# Patient Record
Sex: Female | Born: 1975 | Race: White | Hispanic: No | Marital: Single | Smoking: Current every day smoker
Health system: Southern US, Community
[De-identification: ages and names within clinical notes are randomized; demographics above are authoritative.]

## PROBLEM LIST (undated history)

## (undated) DIAGNOSIS — G459 Transient cerebral ischemic attack, unspecified: Secondary | ICD-10-CM

## (undated) DIAGNOSIS — J449 Chronic obstructive pulmonary disease, unspecified: Secondary | ICD-10-CM

## (undated) DIAGNOSIS — L0390 Cellulitis, unspecified: Secondary | ICD-10-CM

## (undated) DIAGNOSIS — D219 Benign neoplasm of connective and other soft tissue, unspecified: Secondary | ICD-10-CM

## (undated) DIAGNOSIS — D259 Leiomyoma of uterus, unspecified: Secondary | ICD-10-CM

## (undated) HISTORY — PX: TUBAL LIGATION: SHX77

## (undated) HISTORY — PX: CHOLECYSTECTOMY: SHX55

---

## 2012-11-28 DIAGNOSIS — I639 Cerebral infarction, unspecified: Secondary | ICD-10-CM

## 2012-11-28 HISTORY — DX: Cerebral infarction, unspecified: I63.9

## 2014-11-06 ENCOUNTER — Emergency Department: Payer: Self-pay | Admitting: Emergency Medicine

## 2015-02-07 ENCOUNTER — Emergency Department: Payer: Self-pay | Admitting: Emergency Medicine

## 2015-07-23 ENCOUNTER — Encounter: Payer: Self-pay | Admitting: Emergency Medicine

## 2015-07-23 ENCOUNTER — Emergency Department
Admission: EM | Admit: 2015-07-23 | Discharge: 2015-07-23 | Disposition: A | Discharge status: Home / Self Care | Payer: Self-pay | Attending: Emergency Medicine | Admitting: Emergency Medicine

## 2015-07-23 DIAGNOSIS — Z720 Tobacco use: Secondary | ICD-10-CM | POA: Insufficient documentation

## 2015-07-23 DIAGNOSIS — K2970 Gastritis, unspecified, without bleeding: Secondary | ICD-10-CM | POA: Insufficient documentation

## 2015-07-23 HISTORY — DX: Leiomyoma of uterus, unspecified: D25.9

## 2015-07-23 LAB — URINALYSIS COMPLETE WITH MICROSCOPIC (ARMC ONLY)
Bilirubin Urine: NEGATIVE
Glucose, UA: NEGATIVE mg/dL
HGB URINE DIPSTICK: NEGATIVE
Ketones, ur: NEGATIVE mg/dL
Nitrite: NEGATIVE
PH: 6 (ref 5.0–8.0)
PROTEIN: NEGATIVE mg/dL
SPECIFIC GRAVITY, URINE: 1.018 (ref 1.005–1.030)

## 2015-07-23 LAB — COMPREHENSIVE METABOLIC PANEL
ALBUMIN: 4.2 g/dL (ref 3.5–5.0)
ALT: 13 U/L — ABNORMAL LOW (ref 14–54)
AST: 23 U/L (ref 15–41)
Alkaline Phosphatase: 69 U/L (ref 38–126)
Anion gap: 6 (ref 5–15)
BUN: 8 mg/dL (ref 6–20)
CHLORIDE: 107 mmol/L (ref 101–111)
CO2: 23 mmol/L (ref 22–32)
Calcium: 9 mg/dL (ref 8.9–10.3)
Creatinine, Ser: 0.72 mg/dL (ref 0.44–1.00)
GFR calc Af Amer: >60 mL/min (ref >60)
Glucose, Bld: 127 mg/dL — ABNORMAL HIGH (ref 65–99)
POTASSIUM: 3.6 mmol/L (ref 3.5–5.1)
Sodium: 136 mmol/L (ref 135–145)
Total Bilirubin: 0.6 mg/dL (ref 0.3–1.2)
Total Protein: 7.2 g/dL (ref 6.5–8.1)

## 2015-07-23 LAB — CBC
HEMATOCRIT: 41.3 % (ref 35.0–47.0)
Hemoglobin: 13.9 g/dL (ref 12.0–16.0)
MCH: 31.5 pg (ref 26.0–34.0)
MCHC: 33.6 g/dL (ref 32.0–36.0)
MCV: 93.7 fL (ref 80.0–100.0)
Platelets: 289 10*3/uL (ref 150–440)
RBC: 4.41 MIL/uL (ref 3.80–5.20)
RDW: 12.8 % (ref 11.5–14.5)
WBC: 9.2 10*3/uL (ref 3.6–11.0)

## 2015-07-23 NOTE — Discharge Instructions (Signed)
Gastritis, Adult °Gastritis is soreness and puffiness (inflammation) of the lining of the stomach. If you do not get help, gastritis can cause bleeding and sores (ulcers) in the stomach. °HOME CARE  °· Only take medicine as told by your doctor. °· If you were given antibiotic medicines, take them as told. Finish the medicines even if you start to feel better. °· Drink enough fluids to keep your pee (urine) clear or pale yellow. °· Avoid foods and drinks that make your problems worse. Foods you may want to avoid include: °¨ Caffeine or alcohol. °¨ Chocolate. °¨ Mint. °¨ Garlic and onions. °¨ Spicy foods. °¨ Citrus fruits, including oranges, lemons, or limes. °¨ Food containing tomatoes, including sauce, chili, salsa, and pizza. °¨ Fried and fatty foods. °· Eat small meals throughout the day instead of large meals. °GET HELP RIGHT AWAY IF:  °· You have black or dark red poop (stools). °· You throw up (vomit) blood. It may look like coffee grounds. °· You cannot keep fluids down. °· Your belly (abdominal) pain gets worse. °· You have a fever. °· You do not feel better after 1 week. °· You have any other questions or concerns. °MAKE SURE YOU:  °· Understand these instructions. °· Will watch your condition. °· Will get help right away if you are not doing well or get worse. °Document Released: 05/02/2008 Document Revised: 02/06/2012 Document Reviewed: 12/28/2011 °ExitCare® Patient Information ©2015 [employer]. This information is not intended to replace advice given to you by your health care provider. Make sure you discuss any questions you have with your health care provider. ° °

## 2015-07-23 NOTE — ED Notes (Signed)
Pt ambulatory to triage with c/o headache and vomiting this AM. Pt reports right upper dental pain also from a bad tooth. Pt denies hx of migraines or headaches.

## 2015-07-23 NOTE — ED Provider Notes (Signed)
[facility] Emergency Department Provider Note  ____________________________________________  Time seen: On arrival  I have reviewed the triage vital signs and the nursing notes.   HISTORY  Chief Complaint Headache; Dental Pain; and Emesis    HPI Lindsey Acosta is a 39 y.o. female who presents with complaints of toothache and nausea that began this morning. 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 She is also concerned that her toothache which is been gone for a week may be related. She denies fevers chills. She denies diarrhea. No abdominal pain    Past Medical History  Diagnosis Date  . Fibroid uterus     There are no active problems to display for this patient.   Past Surgical History  Procedure Laterality Date  . Cholecystectomy    . Tubal ligation      No current outpatient prescriptions on file.  Allergies Review of patient's allergies indicates no known allergies.  No family history on file.  Social History Social History  Substance Use Topics  . Smoking status: Current Every Day Smoker    Types: Cigarettes  . Smokeless tobacco: None  . Alcohol Use: No    Review of Systems  Constitutional: Negative for fever. Eyes: Negative for visual changes. ENT: Negative for sore throat. Positive for dental pain   Genitourinary: Negative for dysuria. Musculoskeletal: Negative for back pain. Skin: Negative for rash. Neurological: Positive for headaches  ____________________________________________   PHYSICAL EXAM:  VITAL SIGNS: ED Triage Vitals  Enc Vitals Group     BP 07/23/15 1133 131/72 mmHg     Pulse Rate 07/23/15 1133 94     Resp 07/23/15 1133 20     Temp 07/23/15 1133 97.9 F (36.6 C)     Temp Source 07/23/15 1133 Oral     SpO2 07/23/15 1133 98 %     Weight 07/23/15 1133 160 lb (72.576 kg)     Height  07/23/15 1133 5' (1.524 m)     Head Cir --      Peak Flow --      Pain Score 07/23/15 1134 7     Pain Loc --      Pain Edu? --      Excl. in [place]? --      Constitutional: Alert and oriented. Well appearing and in no distress. Eyes: Conjunctivae are normal.  ENT   Head: Normocephalic and atraumatic.   Mouth/Throat: Mucous membranes are moist. No dental abscess noted. Positive gingivitis Cardiovascular: Normal rate, regular rhythm.  Respiratory: Normal respiratory effort without tachypnea nor retractions.  Gastrointestinal: Soft and non-tender in all quadrants. No distention. There is no CVA tenderness. Musculoskeletal: Nontender with normal range of motion in all extremities. Neurologic:  Normal speech and language. No gross focal neurologic deficits are appreciated. Skin:  Skin is warm, dry and intact. No rash noted. Psychiatric: Mood and affect are normal. Patient exhibits appropriate insight and judgment.  ____________________________________________    LABS (pertinent positives/negatives)  Labs Reviewed  COMPREHENSIVE METABOLIC PANEL - Abnormal; Notable for the following:    Glucose, Bld 127 (*)    ALT 13 (*)    All other components within normal limits  URINALYSIS COMPLETEWITH MICROSCOPIC ([facility] ONLY) - Abnormal; Notable for the following:    Color, Urine YELLOW (*)    APPearance CLOUDY (*)    Leukocytes, UA 3+ (*)  Bacteria, UA RARE (*)    Squamous Epithelial / LPF TOO NUMEROUS TO COUNT (*)    All other components within normal limits  CBC    ____________________________________________     ____________________________________________    RADIOLOGY I have personally reviewed any xrays that were ordered on this patient: None  ____________________________________________   PROCEDURES  Procedure(s) performed: none   ____________________________________________   INITIAL IMPRESSION / ASSESSMENT AND PLAN / ED COURSE  Pertinent labs & imaging  results that were available during my care of the patient were reviewed by me and considered in my medical decision making (see chart for details).  Patient overall well-appearing, no abdominal tenderness to palpation. Vitals are unremarkable. We'll discharge her home she has Zofran at home.  ____________________________________________   FINAL CLINICAL IMPRESSION(S) / ED DIAGNOSES  Final diagnoses:  Gastritis     Lavonia Drafts, MD 07/23/15 [phone]

## 2015-08-13 ENCOUNTER — Emergency Department: Payer: Self-pay

## 2015-08-13 ENCOUNTER — Encounter: Payer: Self-pay | Admitting: Emergency Medicine

## 2015-08-13 ENCOUNTER — Emergency Department
Admission: EM | Admit: 2015-08-13 | Discharge: 2015-08-13 | Disposition: A | Discharge status: Home / Self Care | Payer: Self-pay | Attending: Student | Admitting: Student

## 2015-08-13 DIAGNOSIS — Y998 Other external cause status: Secondary | ICD-10-CM | POA: Insufficient documentation

## 2015-08-13 DIAGNOSIS — Z720 Tobacco use: Secondary | ICD-10-CM | POA: Insufficient documentation

## 2015-08-13 DIAGNOSIS — W108XXA Fall (on) (from) other stairs and steps, initial encounter: Secondary | ICD-10-CM | POA: Insufficient documentation

## 2015-08-13 DIAGNOSIS — S5001XA Contusion of right elbow, initial encounter: Secondary | ICD-10-CM | POA: Insufficient documentation

## 2015-08-13 DIAGNOSIS — Y9389 Activity, other specified: Secondary | ICD-10-CM | POA: Insufficient documentation

## 2015-08-13 DIAGNOSIS — Y9289 Other specified places as the place of occurrence of the external cause: Secondary | ICD-10-CM | POA: Insufficient documentation

## 2015-08-13 MED ORDER — HYDROCODONE-ACETAMINOPHEN 5-325 MG PO TABS: 1.0000 | ORAL_TABLET | ORAL | Status: DC | PRN

## 2015-08-13 NOTE — Discharge Instructions (Signed)
Contusion °A contusion is a deep bruise. Contusions happen when an injury causes bleeding under the skin. Signs of bruising include pain, puffiness (swelling), and discolored skin. The contusion may turn blue, purple, or yellow. °HOME CARE  °· Put ice on the injured area. °· Put ice in a plastic bag. °· Place a towel between your skin and the bag. °· Leave the ice on for 15-20 minutes, 03-04 times a day. °· Only take medicine as told by your doctor. °· Rest the injured area. °· If possible, raise (elevate) the injured area to lessen puffiness. °GET HELP RIGHT AWAY IF:  °· You have more bruising or puffiness. °· You have pain that is getting worse. °· Your puffiness or pain is not helped by medicine. °MAKE SURE YOU:  °· Understand these instructions. °· Will watch your condition. °· Will get help right away if you are not doing well or get worse. °Document Released: 05/02/2008 Document Revised: 02/06/2012 Document Reviewed: 09/19/2011 °ExitCare® Patient Information ©2015 [employer]. This information is not intended to replace advice given to you by your health care provider. Make sure you discuss any questions you have with your health care provider. ° °Cryotherapy °Cryotherapy is when you put ice on your injury. Ice helps lessen pain and puffiness (swelling) after an injury. Ice works the best when you start using it in the first 24 to 48 hours after an injury. °HOME CARE °· Put a dry or damp towel between the ice pack and your skin. °· You may press gently on the ice pack. °· Leave the ice on for no more than 10 to 20 minutes at a time. °· Check your skin after 5 minutes to make sure your skin is okay. °· Rest at least 20 minutes between ice pack uses. °· Stop using ice when your skin loses feeling (numbness). °· Do not use ice on someone who cannot tell you when it hurts. This includes small children and people with memory problems (dementia). °GET HELP RIGHT AWAY IF: °· You have white spots on your  skin. °· Your skin turns blue or pale. °· Your skin feels waxy or hard. °· Your puffiness gets worse. °MAKE SURE YOU:  °· Understand these instructions. °· Will watch your condition. °· Will get help right away if you are not doing well or get worse. °Document Released: 05/02/2008 Document Revised: 02/06/2012 Document Reviewed: 07/07/2011 °ExitCare® Patient Information ©2015 [employer]. This information is not intended to replace advice given to you by your health care provider. Make sure you discuss any questions you have with your health care provider. ° °

## 2015-08-13 NOTE — ED Provider Notes (Signed)
[facility] Emergency Department Donye Dauenhauer Note  ____________________________________________  Time seen: Approximately 7:47 PM  I have reviewed the triage vital signs and the nursing notes.   HISTORY  Chief Complaint Arm Injury   HPI Lindsey Acosta is a 39 y.o. female is here with complaint of right elbow pain. Patient states that she hit her elbow here on concrete or railing and now is having severe pain. She states there is very painful to move. She denies any previous fracture to her elbow. She is not taking any over-the-counter medication prior to arrival. Currently she rates her pain 10 out of 10.   Past Medical History  Diagnosis Date  . Fibroid uterus     There are no active problems to display for this patient.   Past Surgical History  Procedure Laterality Date  . Cholecystectomy    . Tubal ligation      Current Outpatient Rx  Name  Route  Sig  Dispense  Refill  . HYDROcodone-acetaminophen (NORCO/VICODIN) 5-325 MG per tablet   Oral   Take 1 tablet by mouth every 4 (four) hours as needed for moderate pain.   20 tablet   0     Allergies Review of patient's allergies indicates no known allergies.  No family history on file.  Social History Social History  Substance Use Topics  . Smoking status: Current Every Day Smoker    Types: Cigarettes  . Smokeless tobacco: None  . Alcohol Use: No    Review of Systems Constitutional: No fever/chills .Cardiovascular: Denies chest pain. Respiratory: Denies shortness of breath. Gastrointestinal:  No nausea, no vomiting.   Genitourinary: Negative for dysuria. Musculoskeletal: Negative for back pain. Skin: Negative for rash. Neurological: Negative for headaches, focal weakness or numbness.  10-point ROS otherwise negative.  ____________________________________________   PHYSICAL EXAM:  VITAL SIGNS: ED Triage Vitals  Enc Vitals Group     BP 08/13/15 1811 142/90 mmHg     Pulse Rate  08/13/15 1811 84     Resp --      Temp 08/13/15 1811 98.2 F (36.8 C)     Temp Source 08/13/15 1811 Oral     SpO2 08/13/15 1811 99 %     Weight 08/13/15 1811 162 lb (73.483 kg)     Height 08/13/15 1811 5' (1.524 m)     Head Cir --      Peak Flow --      Pain Score 08/13/15 1811 10     Pain Loc --      Pain Edu? --      Excl. in [place]? --     Constitutional: Alert and oriented. Well appearing and in no acute distress. Eyes: Conjunctivae are normal. PERRL. EOMI. Head: Atraumatic. Nose: No congestion/rhinnorhea. Neck: No stridor.   Cardiovascular: Normal rate, regular rhythm. Grossly normal heart sounds.  Good peripheral circulation. Respiratory: Normal respiratory effort.  No retractions. Lungs CTAB. Gastrointestinal: Soft and nontender. No distention.  Musculoskeletal: Right posterior elbow no gross deformity was noted. There is no effusion or soft tissue edema noted. Range of motion is restricted secondary to patient's pain. No lower extremity tenderness nor edema.  No joint effusions. Neurologic:  Normal speech and language. No gross focal neurologic deficits are appreciated. No gait instability. Skin:  Skin is warm, dry and intact. No rash noted. No abrasion or ecchymosis was noted. Psychiatric: Mood and affect are normal. Speech and behavior are normal.  ____________________________________________   LABS (all labs ordered are listed, but only  abnormal results are displayed)  Labs Reviewed - No data to display  RADIOLOGY  Right elbow x-ray per radiologist was negative I, Johnn Hai, personally viewed and evaluated these images (plain radiographs) as part of my medical decision making.  ____________________________________________   PROCEDURES  Procedure(s) performed: None  Critical Care performed: No  ____________________________________________   INITIAL IMPRESSION / ASSESSMENT AND PLAN / ED COURSE  Pertinent labs & imaging results that were available  during my care of the patient were reviewed by me and considered in my medical decision making (see chart for details).  Patient had been wearing a sling prior to exam. She was told that she could use ice and elevate with a sling. She is follow-up with her doctor if any continued problems or urgent concerns. ____________________________________________   FINAL CLINICAL IMPRESSION(S) / ED DIAGNOSES  Final diagnoses:  Contusion of right elbow, initial encounter      Johnn Hai, PA-C 08/13/15 2300  Joanne Gavel, MD 08/13/15 [phone]

## 2015-08-13 NOTE — ED Notes (Signed)
Pt with right elbow pain after hitting it on a metal stair rail prior to arrival.

## 2015-08-13 NOTE — ED Notes (Signed)
Pt states she was running up the stairs and fell hit her elbow on either the concrete wall or railing she isn't sure states its very painful when she moves it.

## 2016-05-26 ENCOUNTER — Emergency Department
Admission: EM | Admit: 2016-05-26 | Discharge: 2016-05-26 | Disposition: A | Discharge status: Home / Self Care | Payer: Self-pay | Attending: Emergency Medicine | Admitting: Emergency Medicine

## 2016-05-26 ENCOUNTER — Encounter: Payer: Self-pay | Admitting: Emergency Medicine

## 2016-05-26 DIAGNOSIS — Z79899 Other long term (current) drug therapy: Secondary | ICD-10-CM | POA: Insufficient documentation

## 2016-05-26 DIAGNOSIS — J40 Bronchitis, not specified as acute or chronic: Secondary | ICD-10-CM

## 2016-05-26 DIAGNOSIS — Z8742 Personal history of other diseases of the female genital tract: Secondary | ICD-10-CM | POA: Insufficient documentation

## 2016-05-26 DIAGNOSIS — J209 Acute bronchitis, unspecified: Secondary | ICD-10-CM | POA: Insufficient documentation

## 2016-05-26 DIAGNOSIS — F17210 Nicotine dependence, cigarettes, uncomplicated: Secondary | ICD-10-CM | POA: Insufficient documentation

## 2016-05-26 DIAGNOSIS — F1290 Cannabis use, unspecified, uncomplicated: Secondary | ICD-10-CM | POA: Insufficient documentation

## 2016-05-26 MED ORDER — ALBUTEROL SULFATE HFA 108 (90 BASE) MCG/ACT IN AERS: 2.0000 | INHALATION_SPRAY | Freq: Four times a day (QID) | RESPIRATORY_TRACT | Status: AC | PRN

## 2016-05-26 MED ORDER — AMOXICILLIN 500 MG PO CAPS
500.0000 mg | ORAL_CAPSULE | Freq: Once | ORAL | Status: AC
  Administered 2016-05-26: 500 mg via ORAL
  Filled 2016-05-26: qty 1

## 2016-05-26 MED ORDER — AMOXICILLIN 500 MG PO TABS: 500.0000 mg | ORAL_TABLET | Freq: Three times a day (TID) | ORAL | Status: DC

## 2016-05-26 MED ORDER — HYDROCODONE-HOMATROPINE 5-1.5 MG/5ML PO SYRP: 5.0000 mL | ORAL_SOLUTION | Freq: Four times a day (QID) | ORAL | Status: DC | PRN

## 2016-05-26 NOTE — ED Notes (Signed)
Pt discharged to home.  Family member driving.  Discharge instructions reviewed.  Verbalized understanding.  No questions or concerns at this time.  Teach back verified.  Pt in NAD.  No items left in ED.   

## 2016-05-26 NOTE — ED Notes (Signed)
Pt. State cold/flu like symptoms for the past week.  Pt. States body aches, cough, congestion and fever.  Pt. States taking some OTC medications with little relief.

## 2016-05-26 NOTE — ED Notes (Signed)
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

## 2016-05-26 NOTE — Discharge Instructions (Signed)
Upper Respiratory Infection, Adult Most upper respiratory infections (URIs) are a viral infection of the air passages leading to the lungs. A URI affects the nose, throat, and upper air passages. The most common type of URI is nasopharyngitis and is typically referred to as "the common cold." URIs run their course and usually go away on their own. Most of the time, a URI does not require medical attention, but sometimes a bacterial infection in the upper airways can follow a viral infection. This is called a secondary infection. Sinus and middle ear infections are common types of secondary upper respiratory infections. Bacterial pneumonia can also complicate a URI. A URI can worsen asthma and chronic obstructive pulmonary disease (COPD). Sometimes, these complications can require emergency medical care and may be life threatening.  CAUSES Almost all URIs are caused by viruses. A virus is a type of germ and can spread from one person to another.  RISKS FACTORS You may be at risk for a URI if:   You smoke.   You have chronic heart or lung disease.  You have a weakened defense (immune) system.   You are very young or very old.   You have nasal allergies or asthma.  You work in crowded or poorly ventilated areas.  You work in health care facilities or schools. SIGNS AND SYMPTOMS  Symptoms typically develop 2-3 days after you come in contact with a cold virus. Most viral URIs last 7-10 days. However, viral URIs from the influenza virus (flu virus) can last 14-18 days and are typically more severe. Symptoms may include:   Runny or stuffy (congested) nose.   Sneezing.   Cough.   Sore throat.   Headache.   Fatigue.   Fever.   Loss of appetite.   Pain in your forehead, behind your eyes, and over your cheekbones (sinus pain).  Muscle aches.  DIAGNOSIS  Your health care provider may diagnose a URI by:  Physical exam.  Tests to check that your symptoms are not due to  another condition such as:  Strep throat.  Sinusitis.  Pneumonia.  Asthma. TREATMENT  A URI goes away on its own with time. It cannot be cured with medicines, but medicines may be prescribed or recommended to relieve symptoms. Medicines may help:  Reduce your fever.  Reduce your cough.  Relieve nasal congestion. HOME CARE INSTRUCTIONS   Take medicines only as directed by your health care provider.   Gargle warm saltwater or take cough drops to comfort your throat as directed by your health care provider.  Use a warm mist humidifier or inhale steam from a shower to increase air moisture. This may make it easier to breathe.  Drink enough fluid to keep your urine clear or pale yellow.   Eat soups and other clear broths and maintain good nutrition.   Rest as needed.   Return to work when your temperature has returned to normal or as your health care provider advises. You may need to stay home longer to avoid infecting others. You can also use a face mask and careful hand washing to prevent spread of the virus.  Increase the usage of your inhaler if you have asthma.   Do not use any tobacco products, including cigarettes, chewing tobacco, or electronic cigarettes. If you need help quitting, ask your health care provider. PREVENTION  The best way to protect yourself from getting a cold is to practice good hygiene.   Avoid oral or hand contact with people with cold  symptoms.   Wash your hands often if contact occurs.  There is no clear evidence that vitamin C, vitamin E, echinacea, or exercise reduces the chance of developing a cold. However, it is always recommended to get plenty of rest, exercise, and practice good nutrition.  SEEK MEDICAL CARE IF:   You are getting worse rather than better.   Your symptoms are not controlled by medicine.   You have chills.  You have worsening shortness of breath.  You have brown or red mucus.  You have yellow or brown nasal  discharge.  You have pain in your face, especially when you bend forward.  You have a fever.  You have swollen neck glands.  You have pain while swallowing.  You have white areas in the back of your throat. SEEK IMMEDIATE MEDICAL CARE IF:   You have severe or persistent:  Headache.  Ear pain.  Sinus pain.  Chest pain.  You have chronic lung disease and any of the following:  Wheezing.  Prolonged cough.  Coughing up blood.  A change in your usual mucus.  You have a stiff neck.  You have changes in your:  Vision.  Hearing.  Thinking.  Mood. MAKE SURE YOU:   Understand these instructions.  Will watch your condition.  Will get help right away if you are not doing well or get worse.   This information is not intended to replace advice given to you by your health care provider. Make sure you discuss any questions you have with your health care provider.   Document Released: 05/10/2001 Document Revised: 03/31/2015 Document Reviewed: 02/19/2014 Elsevier Interactive Patient Education 2016 Elsevier Inc.   Take antibiotic and cough syrup as directed. Return to emergency room for any worsening symptoms.

## 2016-05-26 NOTE — ED Provider Notes (Signed)
[facility] Emergency Department Provider Note  ____________________________________________  Time seen: Approximately 10:21 PM  I have reviewed the triage vital signs and the nursing notes.   HISTORY  Chief Complaint Nasal Congestion and Otalgia    HPI Lindsey Acosta is a 40 y.o. female with one-week history of cough, congestion, sore throat, ear pain. Some chest tightness and wheezing. She smokes one half pack per day. Works as a [employer]arry manager.   Past Medical History  Diagnosis Date  . Fibroid uterus     There are no active problems to display for this patient.   Past Surgical History  Procedure Laterality Date  . Cholecystectomy    . Tubal ligation      Current Outpatient Rx  Name  Route  Sig  Dispense  Refill  . albuterol (PROVENTIL HFA;VENTOLIN HFA) 108 (90 Base) MCG/ACT inhaler   Inhalation   Inhale 2 puffs into the lungs every 6 (six) hours as needed for wheezing or shortness of breath.   1 Inhaler   2   . amoxicillin (AMOXIL) 500 MG tablet   Oral   Take 1 tablet (500 mg total) by mouth 3 (three) times daily.   30 tablet   0   . HYDROcodone-acetaminophen (NORCO/VICODIN) 5-325 MG per tablet   Oral   Take 1 tablet by mouth every 4 (four) hours as needed for moderate pain.   20 tablet   0   . HYDROcodone-homatropine (HYCODAN) 5-1.5 MG/5ML syrup   Oral   Take 5 mLs by mouth every 6 (six) hours as needed for cough.   120 mL   0     Allergies Review of patient's allergies indicates no known allergies.  History reviewed. No pertinent family history.  Social History Social History  Substance Use Topics  . Smoking status: Current Every Day Smoker -- 0.50 packs/day    Types: Cigarettes  . Smokeless tobacco: None  . Alcohol Use: No    Review of Systems Constitutional:  fever/chills have resolved. Eyes: No visual changes. ENT: No sore throat. Cardiovascular: Denies chest pain. Respiratory: Per history of present  illness Gastrointestinal: No abdominal pain.   Nauseated last week. With  diarrhea.  No constipation. Musculoskeletal: Negative for back pain. Skin: Negative for rash. Neurological: Negative for focal weakness or numbness. 10-point ROS otherwise negative.  ____________________________________________   PHYSICAL EXAM:  VITAL SIGNS: ED Triage Vitals  Enc Vitals Group     BP 05/26/16 2150 125/76 mmHg     Pulse Rate 05/26/16 2150 94     Resp 05/26/16 2150 18     Temp 05/26/16 2150 98.7 F (37.1 C)     Temp Source 05/26/16 2150 Oral     SpO2 05/26/16 2150 97 %     Weight 05/26/16 2150 150 lb (68.04 kg)     Height 05/26/16 2150 5' (1.524 m)     Head Cir --      Peak Flow --      Pain Score 05/26/16 2150 8     Pain Loc --      Pain Edu? --      Excl. in [place]? --     Constitutional: Alert and oriented. Well appearing and in no acute distress. Eyes: Conjunctivae are normal. PERRL. EOMI. Ears:  Clear with normal landmarks. No erythema. Head: Atraumatic. Nose: No congestion/rhinnorhea. Mouth/Throat: Mucous membranes are moist.  Oropharynx non-erythematous. No lesions. Neck:  Supple.  No adenopathy.   Cardiovascular: Normal rate, regular rhythm. Grossly normal heart sounds.  Good peripheral circulation. Respiratory: Normal respiratory effort. Few rhonchi on left Musculoskeletal: Nml ROM of upper and lower extremity joints. Neurologic:  Normal speech and language. No gross focal neurologic deficits are appreciated. No gait instability. Skin:  Skin is warm, dry and intact. No rash noted. Psychiatric: Mood and affect are normal. Speech and behavior are normal.  ____________________________________________   LABS (all labs ordered are listed, but only abnormal results are displayed)  Labs Reviewed - No data to  display ____________________________________________  EKG   ____________________________________________  RADIOLOGY   ____________________________________________   PROCEDURES  Procedure(s) performed: None  Critical Care performed: No  ____________________________________________   INITIAL IMPRESSION / ASSESSMENT AND PLAN / ED COURSE  Pertinent labs & imaging results that were available during my care of the patient were reviewed by me and considered in my medical decision making (see chart for details).  40 year old with one-week history of cough, congestion, wheezing. Not improving. Concern for bronchitis. She is a smoker. Started on amoxicillin. Given albuterol and Hycodan. She reports having a few prednisone at home that she can take for a few days. Recommended 30 mg, 21 g then 10 mg. She can follow-up with her primary physician or return to the emergency room for any worsening symptoms. ____________________________________________   FINAL CLINICAL IMPRESSION(S) / ED DIAGNOSES  Final diagnoses:  Bronchitis      Mortimer Fries, PA-C 05/26/16 2224  Mortimer Fries, PA-C 05/26/16 [place], MD 05/26/16 [phone]

## 2016-06-04 ENCOUNTER — Emergency Department
Admission: EM | Admit: 2016-06-04 | Discharge: 2016-06-04 | Disposition: A | Discharge status: Home / Self Care | Payer: Self-pay | Attending: Emergency Medicine | Admitting: Emergency Medicine

## 2016-06-04 ENCOUNTER — Emergency Department: Payer: Self-pay

## 2016-06-04 ENCOUNTER — Encounter: Payer: Self-pay | Admitting: Emergency Medicine

## 2016-06-04 DIAGNOSIS — Z79899 Other long term (current) drug therapy: Secondary | ICD-10-CM | POA: Insufficient documentation

## 2016-06-04 DIAGNOSIS — L03116 Cellulitis of left lower limb: Secondary | ICD-10-CM | POA: Insufficient documentation

## 2016-06-04 DIAGNOSIS — F17210 Nicotine dependence, cigarettes, uncomplicated: Secondary | ICD-10-CM | POA: Insufficient documentation

## 2016-06-04 LAB — CBC WITH DIFFERENTIAL/PLATELET
BASOS ABS: 0.1 10*3/uL (ref 0–0.1)
Basophils Relative: 1 %
Eosinophils Absolute: 0.2 10*3/uL (ref 0–0.7)
Eosinophils Relative: 2 %
HEMATOCRIT: 41.7 % (ref 35.0–47.0)
Hemoglobin: 14 g/dL (ref 12.0–16.0)
LYMPHS PCT: 30 %
Lymphs Abs: 3.4 10*3/uL (ref 1.0–3.6)
MCH: 31.6 pg (ref 26.0–34.0)
MCHC: 33.5 g/dL (ref 32.0–36.0)
MCV: 94.4 fL (ref 80.0–100.0)
MONO ABS: 0.7 10*3/uL (ref 0.2–0.9)
Monocytes Relative: 7 %
NEUTROS ABS: 7 10*3/uL — AB (ref 1.4–6.5)
Neutrophils Relative %: 60 %
Platelets: 307 10*3/uL (ref 150–440)
RBC: 4.42 MIL/uL (ref 3.80–5.20)
RDW: 12.8 % (ref 11.5–14.5)
WBC: 11.5 10*3/uL — ABNORMAL HIGH (ref 3.6–11.0)

## 2016-06-04 LAB — BASIC METABOLIC PANEL
ANION GAP: 7 (ref 5–15)
BUN: 8 mg/dL (ref 6–20)
CHLORIDE: 105 mmol/L (ref 101–111)
CO2: 24 mmol/L (ref 22–32)
Calcium: 8.9 mg/dL (ref 8.9–10.3)
Creatinine, Ser: 0.73 mg/dL (ref 0.44–1.00)
Glucose, Bld: 89 mg/dL (ref 65–99)
POTASSIUM: 3.7 mmol/L (ref 3.5–5.1)
SODIUM: 136 mmol/L (ref 135–145)

## 2016-06-04 MED ORDER — NAPROXEN 500 MG PO TABS: 500.0000 mg | ORAL_TABLET | Freq: Two times a day (BID) | ORAL | Status: DC

## 2016-06-04 MED ORDER — SULFAMETHOXAZOLE-TRIMETHOPRIM 800-160 MG PO TABS: 1.0000 | ORAL_TABLET | Freq: Two times a day (BID) | ORAL | Status: DC

## 2016-06-04 MED ORDER — TRAMADOL HCL 50 MG PO TABS: 50.0000 mg | ORAL_TABLET | Freq: Four times a day (QID) | ORAL | Status: DC | PRN

## 2016-06-04 NOTE — Discharge Instructions (Signed)

## 2016-06-04 NOTE — ED Provider Notes (Signed)
[facility] Emergency Department Provider Note  ____________________________________________  Time seen: Approximately 6:39 PM  I have reviewed the triage vital signs and the nursing notes.   HISTORY  Chief Complaint Leg Swelling and Leg Pain    HPI Lindsey Acosta is a 40 y.o. female , NAD, presents to the emergency department with 2 day history of left lower leg pain and redness. States she has an area of skin that is warm to touch with increased pain over the last couple of days. Pain worsens with ambulation and patient notes that she is unable to bear full weight on the left lower extremity. Notes that "cellulitis runs in her family".  Denies any injury or trauma to the leg.  Denies chest pain, shortness of breath, wheezing, visual changes, numbness, weakness, tingling. Has had no fevers, chills, body aches.   Past Medical History  Diagnosis Date  . Fibroid uterus     There are no active problems to display for this patient.   Past Surgical History  Procedure Laterality Date  . Cholecystectomy    . Tubal ligation      Current Outpatient Rx  Name  Route  Sig  Dispense  Refill  . albuterol (PROVENTIL HFA;VENTOLIN HFA) 108 (90 Base) MCG/ACT inhaler   Inhalation   Inhale 2 puffs into the lungs every 6 (six) hours as needed for wheezing or shortness of breath.   1 Inhaler   2   . amoxicillin (AMOXIL) 500 MG tablet   Oral   Take 1 tablet (500 mg total) by mouth 3 (three) times daily.   30 tablet   0   . HYDROcodone-acetaminophen (NORCO/VICODIN) 5-325 MG per tablet   Oral   Take 1 tablet by mouth every 4 (four) hours as needed for moderate pain.   20 tablet   0   . HYDROcodone-homatropine (HYCODAN) 5-1.5 MG/5ML syrup   Oral   Take 5 mLs by mouth every 6 (six) hours as needed for cough.   120 mL   0   . naproxen (NAPROSYN) 500 MG tablet   Oral   Take 1 tablet (500 mg total) by mouth 2 (two) times daily with a meal.   14 tablet   0   .  sulfamethoxazole-trimethoprim (BACTRIM DS,SEPTRA DS) 800-160 MG tablet   Oral   Take 1 tablet by mouth 2 (two) times daily.   14 tablet   0   . traMADol (ULTRAM) 50 MG tablet   Oral   Take 1 tablet (50 mg total) by mouth every 6 (six) hours as needed.   10 tablet   0     Allergies Review of patient's allergies indicates no known allergies.  History reviewed. No pertinent family history.  Social History Social History  Substance Use Topics  . Smoking status: Current Every Day Smoker -- 0.50 packs/day    Types: Cigarettes  . Smokeless tobacco: None  . Alcohol Use: No     Review of Systems  Constitutional: No fever/chills, fatigue Eyes: No visual changes.  Cardiovascular: No chest pain. Respiratory: No shortness of breath. No wheezing.  Gastrointestinal: No abdominal pain.  No nausea, vomiting.   Musculoskeletal: Positive left lower extremity pain. Negative for back pain.  Skin: Positive redness and swelling left lower extremity. Negative for rash, skin sores, open wounds, oozing, weeping. Neurological: Negative for headaches, focal weakness or numbness. No tingling 10-point ROS otherwise negative.  ____________________________________________   PHYSICAL EXAM:  VITAL SIGNS: ED Triage Vitals  Enc Vitals  Group     BP 06/04/16 1622 117/81 mmHg     Pulse Rate 06/04/16 1622 88     Resp 06/04/16 1622 18     Temp 06/04/16 1622 98.2 F (36.8 C)     Temp Source 06/04/16 1622 Oral     SpO2 06/04/16 1622 98 %     Weight 06/04/16 1622 155 lb (70.308 kg)     Height 06/04/16 1622 5' (1.524 m)     Head Cir --      Peak Flow --      Pain Score 06/04/16 1623 9     Pain Loc --      Pain Edu? --      Excl. in [place]? --      Constitutional: Alert and oriented. Well appearing and in no acute distress. Eyes: Conjunctivae are normal.  Head: Atraumatic. Neck: Supple with full range of motion. Hematological/Lymphatic/Immunilogical: No cervical lymphadenopathy. Cardiovascular:  Normal rate, regular rhythm. Normal S1 and S2.  Good peripheral circulation with 2+ pulses noted in the left lower extremity. Capillary refill is brisk in all digits of the left foot. Respiratory: Normal respiratory effort without tachypnea or retractions. Lungs CTAB with breath sounds noted in all lung fields. Musculoskeletal: Full range of motion of the left ankle but pain with flexion at the area of cellulitis. No pain with extension of the left ankle. Full range of motion of toes without pain. No lower extremity edema.  No joint effusions. Neurologic:  Normal speech and language. No gross focal neurologic deficits are appreciated. Sensation to light touch grossly intact about the left lower extremity. Skin:  3 cm x 2 cm oblong area of erythematous, warm skin noted about the anterior portion of the distal left lower extremity. Patient notes that is exquisitely tender to touch. Has pain with palpation distally into the foot but no other areas of cellulitis. No open wounds or lesions. No oozing or weeping. There is a tattoo proximal to the area of erythema but the patient states the tattoo is over 15 years old. Skin is warm, dry and intact.  Psychiatric: Mood and affect are normal. Speech and behavior are normal. Patient exhibits appropriate insight and judgement.   ____________________________________________   LABS (all labs ordered are listed, but only abnormal results are displayed)  Labs Reviewed  CBC WITH DIFFERENTIAL/PLATELET - Abnormal; Notable for the following:    WBC 11.5 (*)    Neutro Abs 7.0 (*)    All other components within normal limits  BASIC METABOLIC PANEL   ____________________________________________  EKG  None ____________________________________________  RADIOLOGY I have personally viewed and evaluated these images (plain radiographs) as part of my medical decision making, as well as reviewing the written report by the radiologist.  US Venous Img Lower Unilateral  Left  06/04/2016  CLINICAL DATA:  Left lower extremity pain, erythema and swelling for 1 day. EXAM: LEFT LOWER EXTREMITY VENOUS DOPPLER ULTRASOUND TECHNIQUE: Gray-scale sonography with graded compression, as well as color Doppler and duplex ultrasound were performed to evaluate the lower extremity deep venous systems from the level of the common femoral vein and including the common femoral, femoral, profunda femoral, popliteal and calf veins including the posterior tibial, peroneal and gastrocnemius veins when visible. The superficial great saphenous vein was also interrogated. Spectral Doppler was utilized to evaluate flow at rest and with distal augmentation maneuvers in the common femoral, femoral and popliteal veins. COMPARISON:  None. FINDINGS: Contralateral Common Femoral Vein: Respiratory phasicity is normal and symmetric  with the symptomatic side. No evidence of thrombus. Normal compressibility. Common Femoral Vein: No evidence of thrombus. Normal compressibility, respiratory phasicity and response to augmentation. Saphenofemoral Junction: No evidence of thrombus. Normal compressibility and flow on color Doppler imaging. Profunda Femoral Vein: No evidence of thrombus. Normal compressibility and flow on color Doppler imaging. Femoral Vein: No evidence of thrombus. Normal compressibility, respiratory phasicity and response to augmentation. Popliteal Vein: No evidence of thrombus. Normal compressibility, respiratory phasicity and response to augmentation. Calf Veins: No evidence of thrombus. Normal compressibility and flow on color Doppler imaging. Superficial Great Saphenous Vein: No evidence of thrombus. Normal compressibility and flow on color Doppler imaging. Venous Reflux:  None. Other Findings:  None. IMPRESSION: No evidence of deep venous thrombosis in the left lower extremity. Electronically Signed   By: Ilona Sorrel M.D.   On: 06/04/2016 18:18     ____________________________________________    PROCEDURES  Procedure(s) performed: None    Medications - No data to display   ____________________________________________   INITIAL IMPRESSION / ASSESSMENT AND PLAN / ED COURSE  Pertinent labs & imaging results that were available during my care of the patient were reviewed by me and considered in my medical decision making (see chart for details).  Patient's diagnosis is consistent with cellulitis of left leg without foot. Patient will be discharged home with prescriptions for naproxen, Bactrim DS, Ultram to take as directed. Patient is advised to keep left lower extremity elevated when not ambulating and keep ambulation to limited to necessary walking over the next couple of days. Patient does have a set of crutches at home that she may use to assist with ambulation. Patient was given a work note to excuse from work over the next 48 hours to allow rest and healing. Patient is to follow up with her primary care provider at [facility] in 48 hours if symptoms persist past this treatment course. Patient is given ED precautions to return to the ED for any worsening or new symptoms.    ____________________________________________  FINAL CLINICAL IMPRESSION(S) / ED DIAGNOSES  Final diagnoses:  Cellulitis of left leg without foot      NEW MEDICATIONS STARTED DURING THIS VISIT:  New Prescriptions   NAPROXEN (NAPROSYN) 500 MG TABLET    Take 1 tablet (500 mg total) by mouth 2 (two) times daily with a meal.   SULFAMETHOXAZOLE-TRIMETHOPRIM (BACTRIM DS,SEPTRA DS) 800-160 MG TABLET    Take 1 tablet by mouth 2 (two) times daily.   TRAMADOL (ULTRAM) 50 MG TABLET    Take 1 tablet (50 mg total) by mouth every 6 (six) hours as needed.         Braxton Feathers, PA-C 06/04/16 2000  Orbie Pyo, MD 06/04/16 2322

## 2016-06-04 NOTE — ED Notes (Addendum)
Pt presents to ED with c/o left lower leg pain and redness since yesterday. Warm to the touch and increased pain with ambulation. No known injury. Pt states "cellulitis runs in her family" and she thinks she may need an antibiotic. Denies fever at home. Light redness and warmth noted just above ankle.

## 2016-06-04 NOTE — ED Notes (Signed)
Pt presents to ed with left lower leg redness and swelling. Extremity warm to the touch. Pt sitting comfortably in bed. [place] at bedside to take her for testing.

## 2016-09-04 ENCOUNTER — Emergency Department
Admission: EM | Admit: 2016-09-04 | Discharge: 2016-09-04 | Disposition: A | Discharge status: Home / Self Care | Payer: Self-pay | Attending: Emergency Medicine | Admitting: Emergency Medicine

## 2016-09-04 ENCOUNTER — Encounter: Payer: Self-pay | Admitting: Emergency Medicine

## 2016-09-04 DIAGNOSIS — F17210 Nicotine dependence, cigarettes, uncomplicated: Secondary | ICD-10-CM | POA: Insufficient documentation

## 2016-09-04 DIAGNOSIS — Z791 Long term (current) use of non-steroidal anti-inflammatories (NSAID): Secondary | ICD-10-CM | POA: Insufficient documentation

## 2016-09-04 DIAGNOSIS — L03116 Cellulitis of left lower limb: Secondary | ICD-10-CM | POA: Insufficient documentation

## 2016-09-04 DIAGNOSIS — Z79899 Other long term (current) drug therapy: Secondary | ICD-10-CM | POA: Insufficient documentation

## 2016-09-04 HISTORY — DX: Benign neoplasm of connective and other soft tissue, unspecified: D21.9

## 2016-09-04 HISTORY — DX: Cellulitis, unspecified: L03.90

## 2016-09-04 MED ORDER — SULFAMETHOXAZOLE-TRIMETHOPRIM 800-160 MG PO TABS: 1.0000 | ORAL_TABLET | Freq: Two times a day (BID) | ORAL | 0 refills | Status: DC

## 2016-09-04 MED ORDER — SULFAMETHOXAZOLE-TRIMETHOPRIM 800-160 MG PO TABS
1.0000 | ORAL_TABLET | Freq: Once | ORAL | Status: AC
  Administered 2016-09-04: 1 via ORAL
  Filled 2016-09-04: qty 1

## 2016-09-04 NOTE — ED Triage Notes (Signed)
Patient with redness and swelling to left lower leg that started yesterday. Patient with history of cellulitis to the same leg in June.

## 2016-09-04 NOTE — ED Provider Notes (Signed)
[facility] Emergency Department Provider Note   ____________________________________________   First MD Initiated Contact with Patient 09/04/16 [phone]     (approximate)  I have reviewed the triage vital signs and the nursing notes.   HISTORY  Chief Complaint Cellulitis   HPI Lindsey Acosta is a 40 y.o. female with a history of cellulitis this past July who is presenting to the emergency department today with redness to the left lower extremity. She says that she bumped her left lower extremity on a patient lift at her job and started having redness ever since this happened. She said that she had this "bump" 5 days ago. Denies any fever but does express pain to the left lower 70. Says that she had a similar case after also bumping herself on the lift in July. Had Bactrim at that time which she tolerated well. It resolved the infection at that time as well.    Past Medical History:  Diagnosis Date  . Cellulitis   . Fibroid uterus   . Fibroids     There are no active problems to display for this patient.   Past Surgical History:  Procedure Laterality Date  . CHOLECYSTECTOMY    . TUBAL LIGATION      Prior to Admission medications   Medication Sig Start Date End Date Taking? Authorizing Provider  albuterol (PROVENTIL HFA;VENTOLIN HFA) 108 (90 Base) MCG/ACT inhaler Inhale 2 puffs into the lungs every 6 (six) hours as needed for wheezing or shortness of breath. 05/26/16   Mortimer Fries, PA-C  amoxicillin (AMOXIL) 500 MG tablet Take 1 tablet (500 mg total) by mouth 3 (three) times daily. 05/26/16   Mortimer Fries, PA-C  HYDROcodone-acetaminophen (NORCO/VICODIN) 5-325 MG per tablet Take 1 tablet by mouth every 4 (four) hours as needed for moderate pain. 08/13/15   Johnn Hai, PA-C  HYDROcodone-homatropine [facility]) 5-1.5 MG/5ML syrup Take 5 mLs by mouth every 6 (six) hours as needed for cough. 05/26/16   Mortimer Fries, PA-C  naproxen (NAPROSYN) 500 MG tablet  Take 1 tablet (500 mg total) by mouth 2 (two) times daily with a meal. 06/04/16   Jami L Hagler, PA-C  sulfamethoxazole-trimethoprim (BACTRIM DS,SEPTRA DS) 800-160 MG tablet Take 1 tablet by mouth 2 (two) times daily. 09/04/16   Orbie Pyo, MD  traMADol (ULTRAM) 50 MG tablet Take 1 tablet (50 mg total) by mouth every 6 (six) hours as needed. 06/04/16   Jami L Hagler, PA-C    Allergies Eggs or egg-derived products; Lemon oil; Lime oil; and Sulfa antibiotics  No family history on file.  Social History Social History  Substance Use Topics  . Smoking status: Current Every Day Smoker    Packs/day: 0.50    Types: Cigarettes  . Smokeless tobacco: Never Used  . Alcohol use No    Review of Systems Constitutional: No fever/chills Eyes: No visual changes. ENT: No sore throat. Cardiovascular: Denies chest pain. Respiratory: Denies shortness of breath. Gastrointestinal: No abdominal pain.  No nausea, no vomiting.  No diarrhea.  No constipation. Genitourinary: Negative for dysuria. Musculoskeletal: Negative for back pain. Skin: As above Neurological: Negative for headaches, focal weakness or numbness.  10-point ROS otherwise negative.  ____________________________________________   PHYSICAL EXAM:  VITAL SIGNS: ED Triage Vitals [09/04/16 0600]  Enc Vitals Group     BP 113/72     Pulse Rate 88     Resp 18     Temp 98.3 F (36.8 C)     Temp Source  Oral     SpO2 99 %     Weight 145 lb (65.8 kg)     Height 5' (1.524 m)     Head Circumference      Peak Flow      Pain Score 8     Pain Loc      Pain Edu?      Excl. in [place]?     Constitutional: Alert and oriented. Well appearing and in no acute distress. Eyes: Conjunctivae are normal. PERRL. EOMI. Head: Atraumatic. Nose: No congestion/rhinnorhea. Mouth/Throat: Mucous membranes are moist.  Neck: No stridor.   Cardiovascular: Normal rate, regular rhythm. Grossly normal heart sounds.  Respiratory: Normal respiratory  effort.  No retractions. Lungs CTAB. Gastrointestinal: Soft and nontender. No distention.  Musculoskeletal: No lower extremity tenderness nor edema.  No joint effusions. Neurologic:  Normal speech and language. No gross focal neurologic deficits are appreciated.  Skin:  Skin is warm, dry and intact. Left lower extremity with erythema extending from the lateral malleolus proximally about 6 cm. It is also in a medial collateral dimension about 7-8 centimeters. Tender to palpation. No fluctuance. No induration. Patient is very tender but she said that this tenderness was similar to the last instance of cellulitis. Psychiatric: Mood and affect are normal. Speech and behavior are normal.  ____________________________________________   LABS (all labs ordered are listed, but only abnormal results are displayed)  Labs Reviewed - No data to display ____________________________________________  EKG   ____________________________________________  RADIOLOGY   ____________________________________________   PROCEDURES  Procedure(s) performed:   Procedures  Critical Care performed:   ____________________________________________   INITIAL IMPRESSION / ASSESSMENT AND PLAN / ED COURSE  Pertinent labs & imaging results that were available during my care of the patient were reviewed by me and considered in my medical decision making (see chart for details).  We will treat patient with Bactrim. Patient says similar appearance to the last instance of cellulitis including the amount of tenderness to palpation. Because of this similarity to previous case, I have a much lower suspicion for necrotizing fasciitis. There is also no crepitus. Patient will be discharged home. We'll prescribe Bactrim, as she had previously.  Clinical Course     ____________________________________________   FINAL CLINICAL IMPRESSION(S) / ED DIAGNOSES  Final diagnoses:  Cellulitis of left lower extremity       NEW MEDICATIONS STARTED DURING THIS VISIT:  New Prescriptions   SULFAMETHOXAZOLE-TRIMETHOPRIM (BACTRIM DS,SEPTRA DS) 800-160 MG TABLET    Take 1 tablet by mouth 2 (two) times daily.     Note:  This document was prepared using Dragon voice recognition software and may include unintentional dictation errors.    Orbie Pyo, MD 09/04/16 [phone]

## 2017-08-31 ENCOUNTER — Encounter: Payer: Self-pay | Admitting: Emergency Medicine

## 2017-08-31 DIAGNOSIS — K0889 Other specified disorders of teeth and supporting structures: Secondary | ICD-10-CM | POA: Insufficient documentation

## 2017-08-31 DIAGNOSIS — Z79899 Other long term (current) drug therapy: Secondary | ICD-10-CM | POA: Insufficient documentation

## 2017-08-31 DIAGNOSIS — F17210 Nicotine dependence, cigarettes, uncomplicated: Secondary | ICD-10-CM | POA: Insufficient documentation

## 2017-08-31 MED ORDER — MAGIC MOUTHWASH W/LIDOCAINE
5.0000 mL | Freq: Once | ORAL | Status: AC
  Administered 2017-08-31: 5 mL via ORAL

## 2017-08-31 MED ORDER — MAGIC MOUTHWASH
ORAL | Status: AC
  Filled 2017-08-31: qty 10

## 2017-08-31 NOTE — ED Triage Notes (Signed)
Pt c/o lower left dental pain x1 week. Pt denies fever as well as drainage.

## 2017-09-01 ENCOUNTER — Emergency Department
Admission: EM | Admit: 2017-09-01 | Discharge: 2017-09-01 | Disposition: A | Discharge status: Home / Self Care | Payer: Self-pay | Attending: Emergency Medicine | Admitting: Emergency Medicine

## 2017-09-01 DIAGNOSIS — K0889 Other specified disorders of teeth and supporting structures: Secondary | ICD-10-CM

## 2017-09-01 MED ORDER — TRAMADOL HCL 50 MG PO TABS: 50.0000 mg | ORAL_TABLET | Freq: Four times a day (QID) | ORAL | 0 refills | Status: DC | PRN

## 2017-09-01 MED ORDER — PENICILLIN V POTASSIUM 500 MG PO TABS: 500.0000 mg | ORAL_TABLET | Freq: Four times a day (QID) | ORAL | 0 refills | Status: DC

## 2017-09-01 NOTE — ED Notes (Signed)

## 2017-09-01 NOTE — ED Provider Notes (Signed)
[facility] Emergency Department Provider Note  Time seen: 2:51 AM  I have reviewed the triage vital signs and the nursing notes.   HISTORY  Chief Complaint Dental Pain    HPI Lindsey Acosta is a 41 y.o. female With no past medical history who presents to the emergency department for left lower molar dental pain. According to the patient the past 5 days she has been having intermittent left lower molar dental pain. She states for the past 12 hours and has been worsening for the past 6 hours it has been severe. Patient denies any fever. Describes the pain as moderate to severe currently. Worse with chewing.  Past Medical History:  Diagnosis Date  . Cellulitis   . Fibroid uterus   . Fibroids     There are no active problems to display for this patient.   Past Surgical History:  Procedure Laterality Date  . CHOLECYSTECTOMY    . TUBAL LIGATION      Prior to Admission medications   Medication Sig Start Date End Date Taking? Authorizing Provider  albuterol (PROVENTIL HFA;VENTOLIN HFA) 108 (90 Base) MCG/ACT inhaler Inhale 2 puffs into the lungs every 6 (six) hours as needed for wheezing or shortness of breath. 05/26/16   Mortimer Fries, PA-C  amoxicillin (AMOXIL) 500 MG tablet Take 1 tablet (500 mg total) by mouth 3 (three) times daily. 05/26/16   Mortimer Fries, PA-C  HYDROcodone-acetaminophen (NORCO/VICODIN) 5-325 MG per tablet Take 1 tablet by mouth every 4 (four) hours as needed for moderate pain. 08/13/15   Johnn Hai, PA-C  HYDROcodone-homatropine (HYCODAN) 5-1.5 MG/5ML syrup Take 5 mLs by mouth every 6 (six) hours as needed for cough. 05/26/16   Mortimer Fries, PA-C  naproxen (NAPROSYN) 500 MG tablet Take 1 tablet (500 mg total) by mouth 2 (two) times daily with a meal. 06/04/16   Hagler, Jami L, PA-C  sulfamethoxazole-trimethoprim (BACTRIM DS,SEPTRA DS) 800-160 MG tablet Take 1 tablet by mouth 2 (two) times daily. 09/04/16   Schaevitz, Randall An, MD   traMADol (ULTRAM) 50 MG tablet Take 1 tablet (50 mg total) by mouth every 6 (six) hours as needed. 06/04/16   Hagler, Jami L, PA-C    Allergies  Allergen Reactions  . Eggs Or Egg-Derived Products   . Lemon Oil   . Lime Oil   . Sulfa Antibiotics     History reviewed. No pertinent family history.  Social History Social History  Substance Use Topics  . Smoking status: Current Every Day Smoker    Packs/day: 0.50    Types: Cigarettes  . Smokeless tobacco: Never Used  . Alcohol use No    Review of Systems Constitutional: Negative for fever. ENT: dental pain Cardiovascular: Negative for chest pain. Respiratory: Negative for shortness of breath. Gastrointestinal: Negative for abdominal pain. Positive for nausea. Negative for vomiting Neurological:mild headache All other ROS negative  ____________________________________________   PHYSICAL EXAM:  VITAL SIGNS: ED Triage Vitals [08/31/17 2312]  Enc Vitals Group     BP (!) 158/92     Pulse Rate 91     Resp 16     Temp 98.3 F (36.8 C)     Temp Source Oral     SpO2 100 %     Weight 145 lb (65.8 kg)     Height      Head Circumference      Peak Flow      Pain Score      Pain Loc  Pain Edu?      Excl. in [place]?     Constitutional: Alert and oriented. Well appearing and in no distress. Eyes: Normal exam ENT   Head: Normocephalic and atraumatic.   Mouth/Throat: Mucous membranes are moist.poor dentition. Fractured left lower molar, no obvious signs of abscess. Floor the mouth is soft. Cardiovascular: Normal rate, regular rhythm. No murmur Respiratory: Normal respiratory effort without tachypnea nor retractions. Breath sounds are clear  Gastrointestinal: Soft and nontender. No distention.   Musculoskeletal: Nontender with normal range of motion in all extremities.  Neurologic:  Normal speech and language. No gross focal neurologic deficits  Skin:  Skin is warm, dry and intact.  Psychiatric: Mood and affect are  normal.   ____________________________________________   INITIAL IMPRESSION / ASSESSMENT AND PLAN / ED COURSE  Pertinent labs & imaging results that were available during my care of the patient were reviewed by me and considered in my medical decision making (see chart for details).  presents the emergency department for dental pain. Differential this time would include dental caries, dental abscess, dental root infection. Exam is most consistent with dental carry with likely underlying infection. We'll place the patient on penicillin and discharged with a short course of tramadol. I discussed with the patient and need to follow up with a dentist as soon as possible we will provide local resources. ____________________________________________   FINAL CLINICAL IMPRESSION(S) / ED DIAGNOSES  dental pain    Harvest Dark, MD 09/01/17 [phone]

## 2017-09-01 NOTE — Discharge Instructions (Signed)
OPTIONS FOR DENTAL FOLLOW UP CARE ° °[place] Department of Health and Human Services - Local Safety Net Dental Clinics °http://www.ncdhhs.gov/dph/oralhealth/services/safetynetclinics.htm °  °[facility] (336-562-3123) ° °[facility] [place] (919-933-9087) ° °[facility] (919-663-1744 ext [phone]) ° °[facility] ([phone]) ° °[facility] ([phone]) °This clinic caters to the indigent population and is on a lottery system. °Location: °[facility], [address], [place] °Clinic Hours: °Wednesdays from 6pm - 9pm, patients seen by a lottery system. °For dates, call or go to [email or web address].edu/[facility]/patients/Dental-[facility] °Services: °Cleanings, fillings and simple extractions. °Payment Options: °DENTAL WORK IS FREE OF CHARGE. Bring proof of income or support. °Best way to get seen: °Arrive at 5:15 pm - this is a lottery, NOT first come/first serve, so arriving earlier will not increase your chances of being seen. °  °  °[facility] °[phone] °Select option 1 for emergencies °  °Location: °[facility], [address], [place] °Clinic Hours: °No walk-ins accepted - call the day before to schedule an appointment. °Check in times are 9:30 am and 1:30 pm. °Services: °Simple extractions, temporary fillings, pulpectomy/pulp debridement, uncomplicated abscess drainage. °Payment Options: °PAYMENT IS DUE AT THE TIME OF SERVICE.  Fee is usually $100-200, additional surgical procedures (e.g. abscess drainage) may be extra. °Cash, checks, Visa/MasterCard accepted.  Can file Medicaid if patient is covered for dental - patient should call case worker to check. °No discount for [facility] patients. °Best way to get seen: °MUST call the day before and get onto the schedule. Can usually be seen the next 1-2 days. No walk-ins accepted. °  °  °[facility] °[phone] °   °Location: °[facility], [address], [place] °Clinic Hours: °M, W, Th, F 8am or 1:30pm, Tues 9a or 1:30 - first come/first served. °Services: °Simple extractions, temporary fillings, uncomplicated abscess drainage.  You do not need to be an [place] resident. °Payment Options: °PAYMENT IS DUE AT THE TIME OF SERVICE. °Dental insurance, otherwise sliding scale - bring proof of income or support. °Depending on income and treatment needed, cost is usually $50-200. °Best way to get seen: °Arrive early as it is first come/first served. °  °  °[facility] °[phone] °  °Location: °[address] °Clinic Hours: °Mon-Thu 8a-5p °Services: °Most basic dental services including extractions and fillings. °Payment Options: °PAYMENT IS DUE AT THE TIME OF SERVICE. °Sliding scale, up to 50% off - bring proof if income or support. °Medicaid with dental option accepted. °Best way to get seen: °Call to schedule an appointment, can usually be seen within 2 weeks OR they will try to see walk-ins - show up at 8a or 2p (you may have to wait). °  °  °[facility] °[phone] °[place] RESIDENTS ONLY °  °Location: °[facility], [address], [place], [place] [address] °Clinic Hours: By appointment only. °Monday - Thursday 8am-5pm, Friday 8am-12pm °Services: Cleanings, fillings, extractions. °Payment Options: °PAYMENT IS DUE AT THE TIME OF SERVICE. °Cash, Visa or MasterCard. Sliding scale - $30 minimum per service. °Best way to get seen: °Come in to office, complete packet and make an appointment - need proof of income °or support monies for each household member and proof of [place] residence. °Usually takes about a month to get in. °  °  °[facility] °[phone] °  °Location: °[address]   [place] °Clinic Hours: Walk-in Urgent Care Dental Services are offered Monday-Friday  mornings only. °The numbers of emergencies accepted daily is limited to the number of °providers available. °Maximum 15 - Mondays, Wednesdays & Thursdays °Maximum 10 - Tuesdays & Fridays °Services: °You do not need to be a [place] resident to be seen for a dental emergency. °Emergencies are defined as pain, swelling, abnormal bleeding, or dental trauma. Walkins will receive x-rays if needed. °NOTE: Dental cleaning is not an emergency. °Payment Options: °PAYMENT IS DUE AT THE TIME OF SERVICE. °Minimum co-pay is $40.00 for uninsured patients. °Minimum co-pay is $3.00 for Medicaid with dental coverage. °Dental Insurance is accepted and must be presented at time of visit. °Medicare does not cover dental. °Forms of payment: Cash, credit card, checks. °Best way to get seen: °If not previously registered with the clinic, walk-in dental registration begins at 7:15 am and is on a first come/first serve basis. °If previously registered with the clinic, call to make an appointment. °  °  °[facility] °[phone] °[place] RESIDENTS ONLY °  °Location: °[address], [place], [place] °Clinic Hours: °Mon-Thu 10a-2p °Services: Extractions only! °Payment Options: °FREE (donations accepted) - bring proof of income or support °Best way to get seen: °Call and schedule an appointment OR come at 8am on the 1st Monday of every month (except for holidays) when it is first come/first served. °  °  °[facility] °[phone] °  °Location: °[address], [place] °Clinic Hours: °Friday mornings °Services, Payment Options, Best way to get seen: °Call for info °

## 2017-12-25 ENCOUNTER — Encounter: Payer: Self-pay | Admitting: Medical Oncology

## 2017-12-25 ENCOUNTER — Emergency Department
Admission: EM | Admit: 2017-12-25 | Discharge: 2017-12-25 | Disposition: A | Discharge status: Home / Self Care | Payer: Self-pay | Attending: Emergency Medicine | Admitting: Emergency Medicine

## 2017-12-25 ENCOUNTER — Emergency Department: Payer: Self-pay

## 2017-12-25 ENCOUNTER — Telehealth: Payer: Self-pay | Admitting: Emergency Medicine

## 2017-12-25 DIAGNOSIS — Z9049 Acquired absence of other specified parts of digestive tract: Secondary | ICD-10-CM | POA: Insufficient documentation

## 2017-12-25 DIAGNOSIS — R102 Pelvic and perineal pain: Secondary | ICD-10-CM | POA: Insufficient documentation

## 2017-12-25 DIAGNOSIS — F17210 Nicotine dependence, cigarettes, uncomplicated: Secondary | ICD-10-CM | POA: Insufficient documentation

## 2017-12-25 LAB — COMPREHENSIVE METABOLIC PANEL
ALBUMIN: 4.2 g/dL (ref 3.5–5.0)
ALT: 12 U/L — AB (ref 14–54)
AST: 20 U/L (ref 15–41)
Alkaline Phosphatase: 68 U/L (ref 38–126)
Anion gap: 10 (ref 5–15)
BUN: 7 mg/dL (ref 6–20)
CALCIUM: 9.2 mg/dL (ref 8.9–10.3)
CHLORIDE: 102 mmol/L (ref 101–111)
CO2: 22 mmol/L (ref 22–32)
CREATININE: 0.65 mg/dL (ref 0.44–1.00)
GFR calc Af Amer: >60 mL/min (ref >60)
GFR calc non Af Amer: >60 mL/min (ref >60)
GLUCOSE: 116 mg/dL — AB (ref 65–99)
Potassium: 3.7 mmol/L (ref 3.5–5.1)
SODIUM: 134 mmol/L — AB (ref 135–145)
Total Bilirubin: 0.4 mg/dL (ref 0.3–1.2)
Total Protein: 7.5 g/dL (ref 6.5–8.1)

## 2017-12-25 LAB — PREGNANCY, URINE: PREG TEST UR: NEGATIVE

## 2017-12-25 LAB — CBC
HCT: 41.8 % (ref 35.0–47.0)
Hemoglobin: 14.1 g/dL (ref 12.0–16.0)
MCH: 31.7 pg (ref 26.0–34.0)
MCHC: 33.8 g/dL (ref 32.0–36.0)
MCV: 93.9 fL (ref 80.0–100.0)
PLATELETS: 321 10*3/uL (ref 150–440)
RBC: 4.45 MIL/uL (ref 3.80–5.20)
RDW: 12.9 % (ref 11.5–14.5)
WBC: 9 10*3/uL (ref 3.6–11.0)

## 2017-12-25 LAB — WET PREP, GENITAL
CLUE CELLS WET PREP: NONE SEEN
Sperm: NONE SEEN
Trich, Wet Prep: NONE SEEN
Yeast Wet Prep HPF POC: NONE SEEN

## 2017-12-25 LAB — URINALYSIS, COMPLETE (UACMP) WITH MICROSCOPIC
Bacteria, UA: NONE SEEN
Bilirubin Urine: NEGATIVE
GLUCOSE, UA: NEGATIVE mg/dL
HGB URINE DIPSTICK: NEGATIVE
Ketones, ur: NEGATIVE mg/dL
Leukocytes, UA: NEGATIVE
Nitrite: NEGATIVE
PROTEIN: NEGATIVE mg/dL
SPECIFIC GRAVITY, URINE: 1.013 (ref 1.005–1.030)
pH: 7 (ref 5.0–8.0)

## 2017-12-25 LAB — CHLAMYDIA/NGC RT PCR (ARMC ONLY)
CHLAMYDIA TR: NOT DETECTED
N GONORRHOEAE: NOT DETECTED

## 2017-12-25 LAB — LIPASE, BLOOD: LIPASE: 27 U/L (ref 11–51)

## 2017-12-25 LAB — POCT PREGNANCY, URINE: Preg Test, Ur: NEGATIVE

## 2017-12-25 MED ORDER — TRAMADOL HCL 50 MG PO TABS: 50.0000 mg | ORAL_TABLET | Freq: Four times a day (QID) | ORAL | 0 refills | Status: AC | PRN

## 2017-12-25 MED ORDER — IBUPROFEN 800 MG PO TABS: 800.0000 mg | ORAL_TABLET | Freq: Three times a day (TID) | ORAL | 0 refills | Status: AC | PRN

## 2017-12-25 MED ORDER — OXYCODONE-ACETAMINOPHEN 5-325 MG PO TABS
2.0000 | ORAL_TABLET | Freq: Once | ORAL | Status: AC
  Administered 2017-12-25: 2 via ORAL
  Filled 2017-12-25: qty 2

## 2017-12-25 NOTE — Telephone Encounter (Signed)
Called patient to inform patient that gonorrhea and chlamydia test will not be completed due to rejected specimen.   I told her that she can have pcp collect again.  She says she is not really concerned about having an std.

## 2017-12-25 NOTE — ED Triage Notes (Signed)
Pt ambulatory to triage with reports of lower abd/pelvic pain that began last night. Pt states that she feels like something "is falling out". Pt reports last BM was this am.

## 2017-12-25 NOTE — ED Provider Notes (Signed)
[facility] Emergency Department Provider Note       Time seen: ----------------------------------------- 9:33 AM on 12/25/2017 -----------------------------------------   I have reviewed the triage vital signs and the nursing notes.  HISTORY   Chief Complaint Abdominal Pain and Back Pain    HPI Lindsey Acosta is a 42 y.o. female with a history of cellulitis, fibroids who presents to the ED for pelvic pain.  Patient reports pelvic pain that began last night.  She reports that when she is sitting down it feels like her intestines are going to fall out.  She denies fevers or chills, has had recent diarrhea but none in the last 24 hours.  She has had a tubal ligation and cholecystectomy in the past.  Patient does not think she is at risk for STDs.  She recently had her period which was not unusual.  Past Medical History:  Diagnosis Date  . Cellulitis   . Fibroid uterus   . Fibroids     There are no active problems to display for this patient.   Past Surgical History:  Procedure Laterality Date  . CHOLECYSTECTOMY    . TUBAL LIGATION      Allergies Eggs or egg-derived products; Lemon oil; Lime oil; and Sulfa antibiotics  Social History Social History   Tobacco Use  . Smoking status: Current Every Day Smoker    Packs/day: 0.50    Types: Cigarettes  . Smokeless tobacco: Never Used  Substance Use Topics  . Alcohol use: No  . Drug use: No    Review of Systems Constitutional: Negative for fever. Cardiovascular: Negative for chest pain. Respiratory: Negative for shortness of breath. Gastrointestinal: Negative for abdominal pain, positive for recent diarrhea Genitourinary: Negative for dysuria. positive for pelvic pain Musculoskeletal: Negative for back pain. Skin: Negative for rash. Neurological: Negative for headaches, focal weakness or numbness.  All systems negative/normal/unremarkable except as stated in the  HPI  ____________________________________________   PHYSICAL EXAM:  VITAL SIGNS: ED Triage Vitals [12/25/17 0903]  Enc Vitals Group     BP 122/69     Pulse Rate 82     Resp 16     Temp 99 F (37.2 C)     Temp Source Oral     SpO2 100 %     Weight 150 lb (68 kg)     Height 5' (1.524 m)     Head Circumference      Peak Flow      Pain Score 7     Pain Loc      Pain Edu?      Excl. in [place]?    Constitutional: Alert and oriented. Well appearing and in no distress. Eyes: Conjunctivae are normal. Normal extraocular movements. ENT   Head: Normocephalic and atraumatic.   Nose: No congestion/rhinnorhea.   Mouth/Throat: Mucous membranes are moist.   Neck: No stridor. Cardiovascular: Normal rate, regular rhythm. No murmurs, rubs, or gallops. Respiratory: Normal respiratory effort without tachypnea nor retractions. Breath sounds are clear and equal bilaterally. No wheezes/rales/rhonchi. Gastrointestinal: Bilateral pelvic tenderness, no rebound or guarding.  Normal bowel sounds. Genitourinary: Musculoskeletal: Nontender with normal range of motion in extremities. No lower extremity tenderness nor edema. Neurologic:  Normal speech and language. No gross focal neurologic deficits are appreciated.  Skin:  Skin is warm, dry and intact. No rash noted. Psychiatric: Mood and affect are normal. Speech and behavior are normal.  ___________________________________________  ED COURSE:  As part of my medical decision making, I reviewed the  following data within the [place] History obtained from family if available, nursing notes, old chart and ekg, as well as notes from prior ED visits. Patient presented for lower abdominal and pelvic pain, we will assess with labs and imaging as indicated at this time.   Procedures ____________________________________________   LABS (pertinent positives/negatives)  Labs Reviewed  WET PREP, GENITAL - Abnormal; Notable for the  following components:      Result Value   WBC, Wet Prep HPF POC FEW (*)    All other components within normal limits  COMPREHENSIVE METABOLIC PANEL - Abnormal; Notable for the following components:   Sodium 134 (*)    Glucose, Bld 116 (*)    ALT 12 (*)    All other components within normal limits  URINALYSIS, COMPLETE (UACMP) WITH MICROSCOPIC - Abnormal; Notable for the following components:   Color, Urine YELLOW (*)    APPearance HAZY (*)    Squamous Epithelial / LPF 0-5 (*)    All other components within normal limits  CHLAMYDIA/NGC RT PCR ([facility] ONLY)  LIPASE, BLOOD  CBC  PREGNANCY, URINE  POCT PREGNANCY, URINE    RADIOLOGY  Pelvic ultrasound IMPRESSION: Normal appearing uterus and endometrium.  Normal appearance of both ovaries. No evidence of ovarian torsion. ____________________________________________  DIFFERENTIAL DIAGNOSIS   STD, PID, ovarian cyst, ovarian torsion, fibroid uterus, pregnancy, appendicitis  FINAL ASSESSMENT AND PLAN  Pelvic pain   Plan: Patient had presented for pelvic pain. Patient's labs were reassuring. Patient's imaging was also reassuring.  She has specific pelvic pain without other etiology.  I do not think she has appendicitis.  This is likely related to a recent ovarian cyst, dysmenorrhea or scar tissue formation.  She is cleared for outpatient follow-up.   Earleen Newport, MD   Note: This note was generated in part or whole with voice recognition software. Voice recognition is usually quite accurate but there are transcription errors that can and very often do occur. I apologize for any typographical errors that were not detected and corrected.     Earleen Newport, MD 12/25/17 [phone]

## 2017-12-25 NOTE — ED Notes (Signed)
Pt to US.

## 2017-12-25 NOTE — ED Triage Notes (Signed)
FIRST NURSE NOTE-pressure to pelvic area.  "feel like everything has to come out". Has been having bowel movement.

## 2018-03-08 ENCOUNTER — Other Ambulatory Visit: Payer: Self-pay

## 2018-03-08 ENCOUNTER — Emergency Department
Admission: EM | Admit: 2018-03-08 | Discharge: 2018-03-08 | Disposition: A | Discharge status: Home / Self Care | Payer: Self-pay | Attending: Emergency Medicine | Admitting: Emergency Medicine

## 2018-03-08 DIAGNOSIS — J039 Acute tonsillitis, unspecified: Secondary | ICD-10-CM | POA: Insufficient documentation

## 2018-03-08 DIAGNOSIS — F17210 Nicotine dependence, cigarettes, uncomplicated: Secondary | ICD-10-CM | POA: Insufficient documentation

## 2018-03-08 DIAGNOSIS — Z79899 Other long term (current) drug therapy: Secondary | ICD-10-CM | POA: Insufficient documentation

## 2018-03-08 LAB — GROUP A STREP BY PCR: Group A Strep by PCR: DETECTED — AB

## 2018-03-08 MED ORDER — ACETAMINOPHEN-CODEINE #3 300-30 MG PO TABS: 1.0000 | ORAL_TABLET | Freq: Four times a day (QID) | ORAL | 0 refills | Status: DC | PRN

## 2018-03-08 MED ORDER — AMOXICILLIN-POT CLAVULANATE 875-125 MG PO TABS: 1.0000 | ORAL_TABLET | Freq: Two times a day (BID) | ORAL | 0 refills | Status: DC

## 2018-03-08 MED ORDER — PREDNISONE 10 MG PO TABS: 50.0000 mg | ORAL_TABLET | Freq: Every day | ORAL | 0 refills | Status: DC

## 2018-03-08 NOTE — ED Provider Notes (Signed)
[facility] Emergency Department Provider Note  ____________________________________________  Time seen: Approximately 3:38 PM  I have reviewed the triage vital signs and the nursing notes.   HISTORY  Chief Complaint Sore Throat and Otalgia    HPI Lindsey Acosta is a 42 y.o. female who presents to the emergency department for treatment and evaluation of sore throat. Pain increased overnight. No relief with tylenol.  Past Medical History:  Diagnosis Date  . Cellulitis   . Fibroid uterus   . Fibroids     There are no active problems to display for this patient.   Past Surgical History:  Procedure Laterality Date  . CHOLECYSTECTOMY    . TUBAL LIGATION      Prior to Admission medications   Medication Sig Start Date End Date Taking? Authorizing Provider  acetaminophen-codeine (TYLENOL #3) 300-30 MG tablet Take 1 tablet by mouth every 6 (six) hours as needed for moderate pain. 03/08/18   Aribelle Mccosh, Johnette Abraham B, FNP  albuterol (PROVENTIL HFA;VENTOLIN HFA) 108 (90 Base) MCG/ACT inhaler Inhale 2 puffs into the lungs every 6 (six) hours as needed for wheezing or shortness of breath. Patient not taking: Reported on 12/25/2017 05/26/16   Mortimer Fries, PA-C  amoxicillin-clavulanate (AUGMENTIN) 875-125 MG tablet Take 1 tablet by mouth 2 (two) times daily. 03/08/18   Javayah Magaw, Johnette Abraham B, FNP  HYDROcodone-acetaminophen (NORCO/VICODIN) 5-325 MG per tablet Take 1 tablet by mouth every 4 (four) hours as needed for moderate pain. Patient not taking: Reported on 12/25/2017 08/13/15   Johnn Hai, PA-C  ibuprofen (ADVIL,MOTRIN) 200 MG tablet Take 200-400 mg by mouth every 6 (six) hours as needed.    [provider]  ibuprofen (ADVIL,MOTRIN) 800 MG tablet Take 1 tablet (800 mg total) by mouth every 8 (eight) hours as needed. 12/25/17   Earleen Newport, MD  penicillin v potassium (VEETID) 500 MG tablet Take 1 tablet (500 mg total) by mouth 4 (four) times  daily. Patient not taking: Reported on 12/25/2017 09/01/17   Harvest Dark, MD  predniSONE (DELTASONE) 10 MG tablet Take 5 tablets (50 mg total) by mouth daily. 03/08/18   Valeree Leidy, Johnette Abraham B, FNP  traMADol (ULTRAM) 50 MG tablet Take 1 tablet (50 mg total) by mouth every 6 (six) hours as needed. Patient not taking: Reported on 12/25/2017 09/01/17   Harvest Dark, MD  traMADol (ULTRAM) 50 MG tablet Take 1 tablet (50 mg total) by mouth every 6 (six) hours as needed. 12/25/17 12/25/18  Earleen Newport, MD    Allergies Eggs or egg-derived products; Lemon oil; Lime oil; and Sulfa antibiotics  No family history on file.  Social History Social History   Tobacco Use  . Smoking status: Current Every Day Smoker    Packs/day: 0.50    Types: Cigarettes  . Smokeless tobacco: Never Used  Substance Use Topics  . Alcohol use: No  . Drug use: No    Review of Systems Constitutional: Positive for fever.  Speech is clear Eyes: No visual changes. ENT: Positive for sore throat; Negative for difficulty swallowing. Respiratory: Denies shortness of breath. Gastrointestinal: No abdominal pain.  No nausea, no vomiting.  No diarrhea.  Genitourinary: Negative for dysuria. Musculoskeletal: Negative for generalized body aches. Skin: Negative for rash. Neurological: Positive for headaches, Negative  focal weakness or numbness.  ____________________________________________   PHYSICAL EXAM:  VITAL SIGNS: ED Triage Vitals  Enc Vitals Group     BP 03/08/18 0942 133/87     Pulse Rate 03/08/18 0942 98  Resp 03/08/18 0942 18     Temp 03/08/18 0942 98.6 F (37 C)     Temp Source 03/08/18 0942 Oral     SpO2 03/08/18 0942 99 %     Weight 03/08/18 0943 160 lb (72.6 kg)     Height 03/08/18 0943 5' (1.524 m)     Head Circumference --      Peak Flow --      Pain Score 03/08/18 0943 8     Pain Loc --      Pain Edu? --      Excl. in [place]? --    Constitutional: Alert and oriented. Well appearing  and in no acute distress. Eyes: Conjunctivae are normal.  Head: Atraumatic. Nose: No congestion/rhinnorhea. Mouth/Throat: Mucous membranes are moist.  Oropharynx erythematous, tonsils 2+ with exudate. Uvula is midline. Neck: No stridor.  Lymphatic: Lymphadenopathy: Bilateral anterior cervical adenopathy. Cardiovascular: Normal rate, regular rhythm. Good peripheral circulation. Respiratory: Normal respiratory effort. Lungs CTAB. Gastrointestinal: Soft and nontender. Musculoskeletal: No lower extremity tenderness nor edema.  Neurologic:  Normal speech and language. No gross focal neurologic deficits are appreciated. Speech is normal. No gait instability. Skin:  Skin is warm, dry and intact. No rash noted Psychiatric: Mood and affect are normal. Speech and behavior are normal.  ____________________________________________   LABS (all labs ordered are listed, but only abnormal results are displayed)  Labs Reviewed  GROUP A STREP BY PCR - Abnormal; Notable for the following components:      Result Value   Group A Strep by PCR DETECTED (*)    All other components within normal limits   ____________________________________________  EKG  Not indicated ____________________________________________  RADIOLOGY  Not indicated ____________________________________________   PROCEDURES  Procedure(s) performed: None  Critical Care performed: No ____________________________________________   INITIAL IMPRESSION / ASSESSMENT AND PLAN / ED COURSE  42 year old female presenting to the emergency department for treatment and evaluation of sore throat.  Symptoms, exam, and labs indicate streptococcal pharyngitis.  She will be placed on Augmentin and given a referral to see ENT if not improving over the next couple of days.  She was instructed to return to the emergency department for symptoms of change or worsen if she is unable to schedule an appointment.  Pertinent labs & imaging results  that were available during my care of the patient were reviewed by me and considered in my medical decision making (see chart for details). ____________________________________________  Discharge Medication List as of 03/08/2018 10:18 AM    START taking these medications   Details  acetaminophen-codeine (TYLENOL #3) 300-30 MG tablet Take 1 tablet by mouth every 6 (six) hours as needed for moderate pain., Starting Thu 03/08/2018, Print    amoxicillin-clavulanate (AUGMENTIN) 875-125 MG tablet Take 1 tablet by mouth 2 (two) times daily., Starting Thu 03/08/2018, Print    predniSONE (DELTASONE) 10 MG tablet Take 5 tablets (50 mg total) by mouth daily., Starting Thu 03/08/2018, Print        FINAL CLINICAL IMPRESSION(S) / ED DIAGNOSES  Final diagnoses:  Tonsillitis    If controlled substance prescribed during this visit, 12 month history viewed on the [place] prior to issuing an initial prescription for Schedule II or III opiod.   Note:  This document was prepared using Dragon voice recognition software and may include unintentional dictation errors.    Victorino Dike, FNP 03/08/18 1542    Schuyler Amor, MD 03/12/18 [phone]

## 2018-03-08 NOTE — ED Triage Notes (Addendum)
Pt comes via POV with c/o sore throat and right ear pain. Pt states her throat and right ear pain started yesterday. Pt states she took some tylenol with no relief. Pt sounds hoarse and states pain when swallowing.

## 2018-03-08 NOTE — ED Notes (Signed)
See triage note  Presents with a 2 day hx of sore throat and swollen gland  Having increased pain with swallowing  Afebrile on arrvial

## 2018-03-28 ENCOUNTER — Encounter: Payer: Self-pay | Admitting: Emergency Medicine

## 2018-03-28 ENCOUNTER — Other Ambulatory Visit: Payer: Self-pay

## 2018-03-28 ENCOUNTER — Emergency Department
Admission: EM | Admit: 2018-03-28 | Discharge: 2018-03-28 | Disposition: A | Discharge status: Home / Self Care | Payer: Self-pay | Attending: Emergency Medicine | Admitting: Emergency Medicine

## 2018-03-28 DIAGNOSIS — M25572 Pain in left ankle and joints of left foot: Secondary | ICD-10-CM | POA: Insufficient documentation

## 2018-03-28 DIAGNOSIS — F17210 Nicotine dependence, cigarettes, uncomplicated: Secondary | ICD-10-CM | POA: Insufficient documentation

## 2018-03-28 DIAGNOSIS — F17200 Nicotine dependence, unspecified, uncomplicated: Secondary | ICD-10-CM | POA: Insufficient documentation

## 2018-03-28 DIAGNOSIS — Z79899 Other long term (current) drug therapy: Secondary | ICD-10-CM | POA: Insufficient documentation

## 2018-03-28 MED ORDER — KETOROLAC TROMETHAMINE 30 MG/ML IJ SOLN
30.0000 mg | Freq: Once | INTRAMUSCULAR | Status: AC
  Administered 2018-03-28: 30 mg via INTRAMUSCULAR
  Filled 2018-03-28: qty 1

## 2018-03-28 MED ORDER — KETOROLAC TROMETHAMINE 10 MG PO TABS: 10.0000 mg | ORAL_TABLET | Freq: Four times a day (QID) | ORAL | 0 refills | Status: DC | PRN

## 2018-03-28 NOTE — ED Notes (Signed)
First Nurse Note:  Patient limping on arrival, placed in [place].  Complaining of pain left ankle and lower leg.

## 2018-03-28 NOTE — ED Provider Notes (Signed)
[facility] Emergency Department Provider Note  ____________________________________________  Time seen: Approximately 8:14 AM  I have reviewed the triage vital signs and the nursing notes.   HISTORY  Chief Complaint Ankle Pain    HPI Lindsey Acosta is a 42 y.o. female that presents to the emergency department for evaluation of left ankle pain for two days.  Pain is primarily in the back of her ankle.  Patient works as a [employer]ar[employer] manager and stepped wrong on her ankle two days ago while using a hoyer lift. She continued to walk but with pain.  After incident, she worked the rest of her shift, on her feet all day.  She states this happens about every 2-3 months. She has had cellulitis in the past in this leg.  She sprained her ankle twice when she was young.  She smokes a pack of cigarettes per week.  She is not on any hormonal contraception.  No recent surgeries or travel.  No history of DVT.  No    Past Medical History:  Diagnosis Date  . Cellulitis   . Fibroid uterus   . Fibroids     There are no active problems to display for this patient.   Past Surgical History:  Procedure Laterality Date  . CHOLECYSTECTOMY    . TUBAL LIGATION      Prior to Admission medications   Medication Sig Start Date End Date Taking? Authorizing Provider  acetaminophen-codeine (TYLENOL #3) 300-30 MG tablet Take 1 tablet by mouth every 6 (six) hours as needed for moderate pain. 03/08/18   Triplett, Johnette Abraham B, FNP  albuterol (PROVENTIL HFA;VENTOLIN HFA) 108 (90 Base) MCG/ACT inhaler Inhale 2 puffs into the lungs every 6 (six) hours as needed for wheezing or shortness of breath. Patient not taking: Reported on 12/25/2017 05/26/16   Mortimer Fries, PA-C  amoxicillin-clavulanate (AUGMENTIN) 875-125 MG tablet Take 1 tablet by mouth 2 (two) times daily. 03/08/18   Triplett, Johnette Abraham B, FNP  HYDROcodone-acetaminophen (NORCO/VICODIN) 5-325 MG per tablet Take 1 tablet by mouth every 4 (four) hours as needed  for moderate pain. Patient not taking: Reported on 12/25/2017 08/13/15   Johnn Hai, PA-C  ibuprofen (ADVIL,MOTRIN) 200 MG tablet Take 200-400 mg by mouth every 6 (six) hours as needed.    [provider]  ibuprofen (ADVIL,MOTRIN) 800 MG tablet Take 1 tablet (800 mg total) by mouth every 8 (eight) hours as needed. 12/25/17   Earleen Newport, MD  ketorolac (TORADOL) 10 MG tablet Take 1 tablet (10 mg total) by mouth every 6 (six) hours as needed. 03/28/18   Laban Emperor, PA-C  penicillin v potassium (VEETID) 500 MG tablet Take 1 tablet (500 mg total) by mouth 4 (four) times daily. Patient not taking: Reported on 12/25/2017 09/01/17   Harvest Dark, MD  predniSONE (DELTASONE) 10 MG tablet Take 5 tablets (50 mg total) by mouth daily. 03/08/18   Triplett, Johnette Abraham B, FNP  traMADol (ULTRAM) 50 MG tablet Take 1 tablet (50 mg total) by mouth every 6 (six) hours as needed. Patient not taking: Reported on 12/25/2017 09/01/17   Harvest Dark, MD  traMADol (ULTRAM) 50 MG tablet Take 1 tablet (50 mg total) by mouth every 6 (six) hours as needed. 12/25/17 12/25/18  Earleen Newport, MD    Allergies Eggs or egg-derived products; Lemon oil; Lime oil; and Sulfa antibiotics  No family history on file.  Social History Social History   Tobacco Use  . Smoking status: Current Every Day Smoker  Packs/day: 0.50    Types: Cigarettes  . Smokeless tobacco: Never Used  Substance Use Topics  . Alcohol use: No  . Drug use: No     Review of Systems  Constitutional: No fever/chills Cardiovascular: No chest pain. Respiratory:  No SOB. Gastrointestinal: No abdominal pain.  No nausea, no vomiting.  Musculoskeletal: Positive for ankle pain.  Skin: Negative for rash, abrasions, lacerations, ecchymosis.  ____________________________________________   PHYSICAL EXAM:  VITAL SIGNS: ED Triage Vitals  Enc Vitals Group     BP 03/28/18 0751 138/90     Pulse Rate 03/28/18 0751 80     Resp  03/28/18 0751 20     Temp 03/28/18 0751 98.1 F (36.7 C)     Temp Source 03/28/18 0751 Oral     SpO2 03/28/18 0751 100 %     Weight 03/28/18 0751 160 lb (72.6 kg)     Height 03/28/18 0751 5' (1.524 m)     Head Circumference --      Peak Flow --      Pain Score 03/28/18 0800 8     Pain Loc --      Pain Edu? --      Excl. in [place]? --      Constitutional: Alert and oriented. Well appearing and in no acute distress. Eyes: Conjunctivae are normal. PERRL. EOMI. Head: Atraumatic. ENT:      Ears:      Nose: No congestion/rhinnorhea.      Mouth/Throat: Mucous membranes are moist.  Neck: No stridor.  Cardiovascular: Normal rate, regular rhythm.  Good peripheral circulation. Respiratory: Normal respiratory effort without tachypnea or retractions. Lungs CTAB. Good air entry to the bases with no decreased or absent breath sounds. Musculoskeletal: Full range of motion to all extremities. No gross deformities appreciated. Tenderness to palpation over achilles tendon. No calf tenderness. Negative Thompson test. No erythema or swelling. Neurologic:  Normal speech and language. No gross focal neurologic deficits are appreciated.  Skin:  Skin is warm, dry and intact. No rash noted. Psychiatric: Mood and affect are normal. Speech and behavior are normal. Patient exhibits appropriate insight and judgement.   ____________________________________________   LABS (all labs ordered are listed, but only abnormal results are displayed)  Labs Reviewed - No data to display ____________________________________________  EKG   ____________________________________________  RADIOLOGY   No results found.  ____________________________________________    PROCEDURES  Procedure(s) performed:    Procedures    Medications  ketorolac (TORADOL) 30 MG/ML injection 30 mg (30 mg Intramuscular Given 03/28/18 0851)     ____________________________________________   INITIAL IMPRESSION / ASSESSMENT  AND PLAN / ED COURSE  Pertinent labs & imaging results that were available during my care of the patient were reviewed by me and considered in my medical decision making (see chart for details).  Review of the  CSRS was performed in accordance of the [place] prior to dispensing any controlled drugs.   Patient presents to emergency department for evaluation of ankle pain. Vital signs and exam are reassuring. Patient declined xray. She is going to buy an ankle brace over the counter. Ankle was ace wrapped. Crutches were given. IM toradol was given. Patient will be discharged home with prescriptions for toradol. Patient is to follow up with PCP as directed. Patient is given ED precautions to return to the ED for any worsening or new symptoms.     ____________________________________________  FINAL CLINICAL IMPRESSION(S) / ED DIAGNOSES  Final diagnoses:  Acute left ankle pain  NEW MEDICATIONS STARTED DURING THIS VISIT:  ED Discharge Orders        Ordered    ketorolac (TORADOL) 10 MG tablet  Every 6 hours PRN     03/28/18 0841          This chart was dictated using voice recognition software/Dragon. Despite best efforts to proofread, errors can occur which can change the meaning. Any change was purely unintentional.    Laban Emperor, PA-C 03/28/18 [place], [place], MD 04/02/18 2022

## 2018-03-28 NOTE — ED Triage Notes (Signed)
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 Pt states went to work this morning and was told to go home due "barely being able to walk". Pt states pain worse with ambulation.

## 2018-03-28 NOTE — ED Notes (Signed)
Pt resting in the bed, PA at bedside, appears in NAD.

## 2018-03-28 NOTE — ED Notes (Signed)
Ace wrap applied, crutches teaching given.

## 2018-05-14 ENCOUNTER — Emergency Department
Admission: EM | Admit: 2018-05-14 | Discharge: 2018-05-14 | Disposition: A | Discharge status: Home / Self Care | Payer: Self-pay | Attending: Emergency Medicine | Admitting: Emergency Medicine

## 2018-05-14 ENCOUNTER — Emergency Department: Payer: Self-pay

## 2018-05-14 ENCOUNTER — Other Ambulatory Visit: Payer: Self-pay

## 2018-05-14 ENCOUNTER — Encounter: Payer: Self-pay | Admitting: Emergency Medicine

## 2018-05-14 DIAGNOSIS — F17210 Nicotine dependence, cigarettes, uncomplicated: Secondary | ICD-10-CM | POA: Insufficient documentation

## 2018-05-14 DIAGNOSIS — R911 Solitary pulmonary nodule: Secondary | ICD-10-CM | POA: Insufficient documentation

## 2018-05-14 DIAGNOSIS — J40 Bronchitis, not specified as acute or chronic: Secondary | ICD-10-CM | POA: Insufficient documentation

## 2018-05-14 MED ORDER — AZITHROMYCIN 500 MG PO TABS
500.0000 mg | ORAL_TABLET | Freq: Once | ORAL | Status: AC
  Administered 2018-05-14: 500 mg via ORAL
  Filled 2018-05-14: qty 1

## 2018-05-14 MED ORDER — AZITHROMYCIN 250 MG PO TABS: ORAL_TABLET | ORAL | 0 refills | Status: DC

## 2018-05-14 MED ORDER — IPRATROPIUM-ALBUTEROL 0.5-2.5 (3) MG/3ML IN SOLN
3.0000 mL | Freq: Once | RESPIRATORY_TRACT | Status: AC
  Administered 2018-05-14: 3 mL via RESPIRATORY_TRACT
  Filled 2018-05-14: qty 3

## 2018-05-14 MED ORDER — BENZONATATE 100 MG PO CAPS
100.0000 mg | ORAL_CAPSULE | Freq: Three times a day (TID) | ORAL | 0 refills | Status: AC | PRN
Start: 2018-05-14 — End: 2019-05-14

## 2018-05-14 NOTE — ED Notes (Signed)
Pt to the er for cough and congestion. Pt says she is having a difficult time breathing. Pt works at [employer]. Pt has a hx of bronchitis. Pt has a headache. Pt has been taking tylenol cold and mucus.

## 2018-05-14 NOTE — ED Triage Notes (Addendum)
Patient ambulatory to triage with steady gait, without difficulty or distress noted; pt reports since Thursday having congestion and prod cough yellow sputum; +smoker with hx bronchitis

## 2018-05-14 NOTE — Discharge Instructions (Signed)
Please follow-up with Lindsey Acosta as seen as possible for outpatient CT scan to evaluate pulmonary nodule seen on chest x-ray.

## 2018-05-14 NOTE — ED Provider Notes (Signed)
[facility] Emergency Department Provider Note  ____________________________________________  Time seen: Approximately 10:32 PM  I have reviewed the triage vital signs and the nursing notes.   HISTORY  Chief Complaint Cough and Nasal Congestion    HPI Lindsey Acosta is a 42 y.o. female that presents to the emergency department for evaluation of post nasal drip and productive cough for 5 days.  Patient states that she is coughing up green and yellow sputum.  She has pain and shortness of breath during coughing episodes.  She smokes less than a pack of cigarettes per day.  No history of asthma or allergies.  She gets bronchitis once a year.  No fever, chills, vomiting, abdominal pain.   Past Medical History:  Diagnosis Date  . Cellulitis   . Fibroid uterus   . Fibroids     There are no active problems to display for this patient.   Past Surgical History:  Procedure Laterality Date  . CHOLECYSTECTOMY    . TUBAL LIGATION      Prior to Admission medications   Medication Sig Start Date End Date Taking? Authorizing Provider  acetaminophen-codeine (TYLENOL #3) 300-30 MG tablet Take 1 tablet by mouth every 6 (six) hours as needed for moderate pain. 03/08/18   Triplett, Johnette Abraham B, FNP  albuterol (PROVENTIL HFA;VENTOLIN HFA) 108 (90 Base) MCG/ACT inhaler Inhale 2 puffs into the lungs every 6 (six) hours as needed for wheezing or shortness of breath. Patient not taking: Reported on 12/25/2017 05/26/16   Mortimer Fries, PA-C  amoxicillin-clavulanate (AUGMENTIN) 875-125 MG tablet Take 1 tablet by mouth 2 (two) times daily. 03/08/18   Triplett, Johnette Abraham B, FNP  azithromycin (ZITHROMAX Z-PAK) 250 MG tablet Take 2 tablets (500 mg) on  Day 1,  followed by 1 tablet (250 mg) once daily on Days 2 through 5. 05/14/18   Laban Emperor, PA-C  benzonatate (TESSALON PERLES) 100 MG capsule Take 1 capsule (100 mg total) by mouth 3 (three) times daily as needed for cough. 05/14/18 05/14/19   Laban Emperor, PA-C  HYDROcodone-acetaminophen (NORCO/VICODIN) 5-325 MG per tablet Take 1 tablet by mouth every 4 (four) hours as needed for moderate pain. Patient not taking: Reported on 12/25/2017 08/13/15   Johnn Hai, PA-C  ibuprofen (ADVIL,MOTRIN) 200 MG tablet Take 200-400 mg by mouth every 6 (six) hours as needed.    [provider]  ibuprofen (ADVIL,MOTRIN) 800 MG tablet Take 1 tablet (800 mg total) by mouth every 8 (eight) hours as needed. 12/25/17   Earleen Newport, MD  ketorolac (TORADOL) 10 MG tablet Take 1 tablet (10 mg total) by mouth every 6 (six) hours as needed. 03/28/18   Laban Emperor, PA-C  penicillin v potassium (VEETID) 500 MG tablet Take 1 tablet (500 mg total) by mouth 4 (four) times daily. Patient not taking: Reported on 12/25/2017 09/01/17   Harvest Dark, MD  predniSONE (DELTASONE) 10 MG tablet Take 5 tablets (50 mg total) by mouth daily. 03/08/18   Triplett, Johnette Abraham B, FNP  traMADol (ULTRAM) 50 MG tablet Take 1 tablet (50 mg total) by mouth every 6 (six) hours as needed. Patient not taking: Reported on 12/25/2017 09/01/17   Harvest Dark, MD  traMADol (ULTRAM) 50 MG tablet Take 1 tablet (50 mg total) by mouth every 6 (six) hours as needed. 12/25/17 12/25/18  Earleen Newport, MD    Allergies Eggs or egg-derived products; Lemon oil; Lime oil; and Sulfa antibiotics  No family history on file.  Social History Social History  Tobacco Use  . Smoking status: Current Every Day Smoker    Packs/day: 0.50    Types: Cigarettes  . Smokeless tobacco: Never Used  Substance Use Topics  . Alcohol use: No  . Drug use: No     Review of Systems  Constitutional: No fever/chills Eyes: No visual changes. No discharge. ENT: Positive for congestion and rhinorrhea. Cardiovascular: No chest pain. Respiratory: Positive for cough.  Gastrointestinal: No abdominal pain.  No nausea, no vomiting.   Musculoskeletal: Negative for musculoskeletal pain. Skin:  Negative for rash, abrasions, lacerations, ecchymosis.   ____________________________________________   PHYSICAL EXAM:  VITAL SIGNS: ED Triage Vitals [05/14/18 2114]  Enc Vitals Group     BP 139/87     Pulse Rate (!) 109     Resp 18     Temp 98.9 F (37.2 C)     Temp Source Oral     SpO2 97 %     Weight 150 lb (68 kg)     Height 5' (1.524 m)     Head Circumference      Peak Flow      Pain Score 0     Pain Loc      Pain Edu?      Excl. in [place]?      Constitutional: Alert and oriented. Well appearing and in no acute distress. Eyes: Conjunctivae are normal. PERRL. EOMI. No discharge. Head: Atraumatic. ENT: No frontal and maxillary sinus tenderness.      Ears: Tympanic membranes pearly gray with good landmarks. No discharge.      Nose: Mild congestion/rhinnorhea.      Mouth/Throat: Mucous membranes are moist. Oropharynx non-erythematous. Tonsils not enlarged. No exudates. Uvula midline. Neck: No stridor.   Hematological/Lymphatic/Immunilogical: No cervical lymphadenopathy. Cardiovascular: Normal rate, regular rhythm.  Good peripheral circulation. Respiratory: Normal respiratory effort without tachypnea or retractions. Lungs CTAB. Good air entry to the bases with no decreased or absent breath sounds. Gastrointestinal: Bowel sounds 4 quadrants. Soft and nontender to palpation. No guarding or rigidity. No palpable masses. No distention. Musculoskeletal: Full range of motion to all extremities. No gross deformities appreciated. Neurologic:  Normal speech and language. No gross focal neurologic deficits are appreciated.  Skin:  Skin is warm, dry and intact. No rash noted. Psychiatric: Mood and affect are normal. Speech and behavior are normal. Patient exhibits appropriate insight and judgement.   ____________________________________________   LABS (all labs ordered are listed, but only abnormal results are displayed)  Labs Reviewed - No data to  display ____________________________________________  EKG   ____________________________________________  RADIOLOGY Robinette Haines, personally viewed and evaluated these images (plain radiographs) as part of my medical decision making, as well as reviewing the written report by the radiologist.  Dg Chest 2 View  Result Date: 05/14/2018 CLINICAL DATA:  Congestion and productive cough since Thursday. Smoker. History of bronchitis. EXAM: CHEST - 2 VIEW COMPARISON:  11/06/2014 FINDINGS: Vague nodular opacity projected over the left costophrenic angle measuring about 6 mm in diameter. This was not present on previous study. Suggest chest CT to exclude developing parenchymal nodule. Otherwise, lungs are clear. There is no evidence of edema or consolidation. No blunting of costophrenic angles. No pneumothorax. Heart size and pulmonary vascularity are normal. Mediastinal contours appear intact. IMPRESSION: 1. Developing 6 mm nodular opacity in the left costophrenic angle. Suggest CT to exclude parenchymal nodule. 2. No evidence of active consolidation. Electronically Signed   By: Lucienne Capers M.D.   On: 05/14/2018 22:04  ____________________________________________    PROCEDURES  Procedure(s) performed:    Procedures    Medications  ipratropium-albuterol (DUONEB) 0.5-2.5 (3) MG/3ML nebulizer solution 3 mL (3 mLs Nebulization Given 05/14/18 2259)  azithromycin (ZITHROMAX) tablet 500 mg (500 mg Oral Given 05/14/18 2258)     ____________________________________________   INITIAL IMPRESSION / ASSESSMENT AND PLAN / ED COURSE  Pertinent labs & imaging results that were available during my care of the patient were reviewed by me and considered in my medical decision making (see chart for details).  Review of the [place] CSRS was performed in accordance of the [place] prior to dispensing any controlled drugs.     Patient's diagnosis is consistent with bronchitis. Vital signs and exam are  reassuring. Chest xray is concerning for pulmonary nodule. Findings were discussed with patient and she is going to call [facility] in the morning for outpatient CT. Extensive discussion and education was provided with patient about pulmonary nodules. She has good follow up. Symptoms improved after duoneb. Patient feels comfortable going home. Patient will be discharged home with prescriptions for azithromycin and tessalon pearles. Patient does not want prednisone because it makes her sick. She has an albuterol inhaler at home. Patient is to follow up with PCP as needed or otherwise directed. Patient is given ED precautions to return to the ED for any worsening or new symptoms.     ____________________________________________  FINAL CLINICAL IMPRESSION(S) / ED DIAGNOSES  Final diagnoses:  Bronchitis  Pulmonary nodule      NEW MEDICATIONS STARTED DURING THIS VISIT:  ED Discharge Orders        Ordered    azithromycin (ZITHROMAX Z-PAK) 250 MG tablet     05/14/18 2250    benzonatate (TESSALON PERLES) 100 MG capsule  3 times daily PRN     05/14/18 2250          This chart was dictated using voice recognition software/Dragon. Despite best efforts to proofread, errors can occur which can change the meaning. Any change was purely unintentional.    Laban Emperor, PA-C 05/15/18 [place], [place], MD 05/18/18 [phone]

## 2019-02-19 ENCOUNTER — Encounter: Payer: Self-pay | Admitting: Emergency Medicine

## 2019-02-19 ENCOUNTER — Emergency Department
Admission: EM | Admit: 2019-02-19 | Discharge: 2019-02-19 | Disposition: A | Discharge status: Home / Self Care | Payer: Self-pay | Attending: Emergency Medicine | Admitting: Emergency Medicine

## 2019-02-19 DIAGNOSIS — R0789 Other chest pain: Secondary | ICD-10-CM | POA: Insufficient documentation

## 2019-02-19 DIAGNOSIS — F17210 Nicotine dependence, cigarettes, uncomplicated: Secondary | ICD-10-CM | POA: Insufficient documentation

## 2019-02-19 DIAGNOSIS — R05 Cough: Secondary | ICD-10-CM | POA: Insufficient documentation

## 2019-02-19 DIAGNOSIS — R059 Cough, unspecified: Secondary | ICD-10-CM

## 2019-02-19 MED ORDER — ALBUTEROL SULFATE HFA 108 (90 BASE) MCG/ACT IN AERS: 2.0000 | INHALATION_SPRAY | Freq: Four times a day (QID) | RESPIRATORY_TRACT | 0 refills | Status: AC | PRN

## 2019-02-19 MED ORDER — PREDNISONE 10 MG (21) PO TBPK: ORAL_TABLET | ORAL | 0 refills | Status: DC

## 2019-02-19 MED ORDER — BENZONATATE 100 MG PO CAPS: 100.0000 mg | ORAL_CAPSULE | Freq: Four times a day (QID) | ORAL | 0 refills | Status: AC | PRN

## 2019-02-19 NOTE — Discharge Instructions (Addendum)
Please seek medical attention for any high fevers, chest pain, shortness of breath, change in behavior, persistent vomiting, bloody stool or any other new or concerning symptoms.  

## 2019-02-19 NOTE — ED Provider Notes (Signed)
[facility] Emergency Department Provider Note  ____________________________________________   I have reviewed the triage vital signs and the nursing notes.   HISTORY  Chief Complaint Cough   History limited by: Not Limited   HPI Lindsey Acosta is a 43 y.o. female who presents to the emergency department today because of concerns for cough and bronchitis.  She states that her symptoms started earlier today.  She has been having some issues with her allergies with pollen coming in.  She is concerned because she feels like the bronchitis has now started because of the allergies.  She states she has a history of bronchitis.  Says been accompanied by some mild chest discomfort.  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  She denies any fevers.   Records reviewed. Per medical record review patient has a history of emergency department visits for bronchitis in the past.   Past Medical History:  Diagnosis Date  . Cellulitis   . Fibroid uterus   . Fibroids     There are no active problems to display for this patient.   Past Surgical History:  Procedure Laterality Date  . CHOLECYSTECTOMY    . TUBAL LIGATION      Prior to Admission medications   Medication Sig Start Date End Date Taking? Authorizing Provider  acetaminophen-codeine (TYLENOL #3) 300-30 MG tablet Take 1 tablet by mouth every 6 (six) hours as needed for moderate pain. 03/08/18   Triplett, Johnette Abraham B, FNP  albuterol (PROVENTIL HFA;VENTOLIN HFA) 108 (90 Base) MCG/ACT inhaler Inhale 2 puffs into the lungs every 6 (six) hours as needed for wheezing or shortness of breath. Patient not taking: Reported on 12/25/2017 05/26/16   Mortimer Fries, PA-C  amoxicillin-clavulanate (AUGMENTIN) 875-125 MG tablet Take 1 tablet by mouth 2 (two) times daily. 03/08/18   Triplett, Johnette Abraham  B, FNP  azithromycin (ZITHROMAX Z-PAK) 250 MG tablet Take 2 tablets (500 mg) on  Day 1,  followed by 1 tablet (250 mg) once daily on Days 2 through 5. 05/14/18   Laban Emperor, PA-C  benzonatate (TESSALON PERLES) 100 MG capsule Take 1 capsule (100 mg total) by mouth 3 (three) times daily as needed for cough. 05/14/18 05/14/19  Laban Emperor, PA-C  HYDROcodone-acetaminophen (NORCO/VICODIN) 5-325 MG per tablet Take 1 tablet by mouth every 4 (four) hours as needed for moderate pain. Patient not taking: Reported on 12/25/2017 08/13/15   Johnn Hai, PA-C  ibuprofen (ADVIL,MOTRIN) 200 MG tablet Take 200-400 mg by mouth every 6 (six) hours as needed.    [provider]  ibuprofen (ADVIL,MOTRIN) 800 MG tablet Take 1 tablet (800 mg total) by mouth every 8 (eight) hours as needed. 12/25/17   Earleen Newport, MD  ketorolac (TORADOL) 10 MG tablet Take 1 tablet (10 mg total) by mouth every 6 (six) hours as needed. 03/28/18   Laban Emperor, PA-C  penicillin v potassium (VEETID) 500 MG tablet Take 1 tablet (500 mg total) by mouth 4 (four) times daily. Patient not taking: Reported on 12/25/2017 09/01/17   Harvest Dark, MD  predniSONE (DELTASONE) 10 MG tablet Take 5 tablets (50 mg total) by mouth daily. 03/08/18   Triplett, Johnette Abraham B, FNP  traMADol (ULTRAM) 50 MG tablet Take 1 tablet (50 mg total) by mouth every 6 (six) hours as needed. Patient not taking: Reported on 12/25/2017 09/01/17   Harvest Dark, MD  Allergies Eggs or egg-derived products; Lemon oil; Lime oil; and Sulfa antibiotics  No family history on file.  Social History Social History   Tobacco Use  . Smoking status: Current Every Day Smoker    Packs/day: 0.50    Types: Cigarettes  . Smokeless tobacco: Never Used  Substance Use Topics  . Alcohol use: No  . Drug use: No    Review of Systems Constitutional: No fever/chills Eyes: No visual changes. ENT: No sore throat. Cardiovascular: Positive for chest  pain. Respiratory: Positive for cough. Gastrointestinal: No abdominal pain.  No nausea, no vomiting.  No diarrhea.   Genitourinary: Negative for dysuria. Musculoskeletal: Negative for back pain. Skin: Negative for rash. Neurological: Negative for headaches, focal weakness or numbness.  ____________________________________________   PHYSICAL EXAM:  VITAL SIGNS: ED Triage Vitals  Enc Vitals Group     BP 02/19/19 2307 121/70     Pulse Rate 02/19/19 2307 86     Resp 02/19/19 2307 18     Temp 02/19/19 2307 97.9 F (36.6 C)     Temp Source 02/19/19 2307 Oral     SpO2 02/19/19 2307 98 %     Weight 02/19/19 2304 140 lb (63.5 kg)     Height 02/19/19 2304 5' (1.524 m)     Head Circumference --      Peak Flow --      Pain Score 02/19/19 2304 5   Constitutional: Alert and oriented.  Eyes: Conjunctivae are normal.  ENT      Head: Normocephalic and atraumatic.      Nose: No congestion/rhinnorhea.      Mouth/Throat: Mucous membranes are moist.      Neck: No stridor. Hematological/Lymphatic/Immunilogical: No cervical lymphadenopathy. Cardiovascular: Normal rate, regular rhythm.  No murmurs, rubs, or gallops.  Respiratory: Normal respiratory effort without tachypnea nor retractions. Breath sounds are clear and equal bilaterally. No wheezes/rales/rhonchi. Gastrointestinal: Soft and non tender. No rebound. No guarding.  Genitourinary: Deferred Musculoskeletal: Normal range of motion in all extremities. No lower extremity edema. Neurologic:  Normal speech and language. No gross focal neurologic deficits are appreciated.  Skin:  Skin is warm, dry and intact. No rash noted. Psychiatric: Mood and affect are normal. Speech and behavior are normal. Patient exhibits appropriate insight and judgment.  ____________________________________________    LABS (pertinent  positives/negatives)  None  ____________________________________________   EKG  None  ____________________________________________    RADIOLOGY  None  ____________________________________________   PROCEDURES  Procedures  ____________________________________________   INITIAL IMPRESSION / ASSESSMENT AND PLAN / ED COURSE  Pertinent labs & imaging results that were available during my care of the patient were reviewed by me and considered in my medical decision making (see chart for details).   Patient presented to the emergency department today with concern for allergies and bronchitis. The patient states that symptoms started today. No fever. Lung auscultation without wheezing or rhonchi. At this point think bronchitis likely, patient is a smoker. Did discuss and offer to obtain CXR and blood work however patient felt comfortable deferring which I think is reasonable. Did discuss return precautions.   ____________________________________________   FINAL CLINICAL IMPRESSION(S) / ED DIAGNOSES  Final diagnoses:  Cough     Note: This dictation was prepared with Dragon dictation. Any transcriptional errors that result from this process are unintentional     Nance Pear, MD 02/19/19 2358

## 2019-02-19 NOTE — ED Triage Notes (Signed)
Patient ambulatory to triage with steady gait, without difficulty or distress noted; pt reports prod cough green sputum (st hx bronchitis) accomp by chest discomfort with coughing since yesterday accomp by sinus congestion; denies fever; pt employed at [employer] ED

## 2019-05-31 ENCOUNTER — Other Ambulatory Visit: Payer: Self-pay

## 2019-05-31 ENCOUNTER — Emergency Department
Admission: EM | Admit: 2019-05-31 | Discharge: 2019-05-31 | Disposition: A | Discharge status: Home / Self Care | Payer: Self-pay | Attending: Emergency Medicine | Admitting: Emergency Medicine

## 2019-05-31 ENCOUNTER — Encounter: Payer: Self-pay | Admitting: Emergency Medicine

## 2019-05-31 DIAGNOSIS — Y93E1 Activity, personal bathing and showering: Secondary | ICD-10-CM | POA: Insufficient documentation

## 2019-05-31 DIAGNOSIS — F17210 Nicotine dependence, cigarettes, uncomplicated: Secondary | ICD-10-CM | POA: Insufficient documentation

## 2019-05-31 DIAGNOSIS — M7061 Trochanteric bursitis, right hip: Secondary | ICD-10-CM | POA: Insufficient documentation

## 2019-05-31 DIAGNOSIS — M7062 Trochanteric bursitis, left hip: Secondary | ICD-10-CM | POA: Insufficient documentation

## 2019-05-31 MED ORDER — METHOCARBAMOL 500 MG PO TABS: 500.0000 mg | ORAL_TABLET | Freq: Every day | ORAL | 0 refills | Status: DC

## 2019-05-31 MED ORDER — MELOXICAM 7.5 MG PO TABS
15.0000 mg | ORAL_TABLET | Freq: Once | ORAL | Status: AC
  Administered 2019-05-31: 15 mg via ORAL
  Filled 2019-05-31: qty 2

## 2019-05-31 MED ORDER — MELOXICAM 15 MG PO TABS: 15.0000 mg | ORAL_TABLET | Freq: Every day | ORAL | 0 refills | Status: DC

## 2019-05-31 NOTE — ED Notes (Signed)
Pt reports lower back and bilat hip pain for the last 3 weeks - she states that the pain has increased in intensity - denies injury

## 2019-05-31 NOTE — ED Triage Notes (Signed)
Pt c/o bilateral hip pain and lower back pain. Pt states that she has been having pain for 2-3 weeks. Pt has appt with her PCP on Wednesday but she could not wait until then. Pt is in NAD

## 2019-05-31 NOTE — ED Provider Notes (Signed)
[facility] Emergency Department Provider Note  ____________________________________________  Time seen: Approximately 1:30 PM  I have reviewed the triage vital signs and the nursing notes.   HISTORY  Chief Complaint Back Pain    HPI Lindsey Acosta is a 43 y.o. female who presents emergency department complaining of posterior pelvic and bilateral hip pain.  Patient reports that approximately 3 weeks ago she was attempting to shave her legs in the shower when her feet split and she did a split in the shower.  Patient reports that since then she has had worsening bilateral hip and posterior pelvic pain.  No back pain.  Patient denies any numbness or tingling in the feet.  No bowel or bladder dysfunction or saddle anesthesia.  Patient reports that it is better with movement and worse when resting for prolonged periods of time.         Past Medical History:  Diagnosis Date  . Cellulitis   . Fibroid uterus   . Fibroids     There are no active problems to display for this patient.   Past Surgical History:  Procedure Laterality Date  . CHOLECYSTECTOMY    . TUBAL LIGATION      Prior to Admission medications   Medication Sig Start Date End Date Taking? Authorizing Provider  acetaminophen-codeine (TYLENOL #3) 300-30 MG tablet Take 1 tablet by mouth every 6 (six) hours as needed for moderate pain. 03/08/18   Triplett, Johnette Abraham B, FNP  albuterol (PROVENTIL HFA;VENTOLIN HFA) 108 (90 Base) MCG/ACT inhaler Inhale 2 puffs into the lungs every 6 (six) hours as needed for wheezing or shortness of breath. Patient not taking: Reported on 12/25/2017 05/26/16   Mortimer Fries, PA-C  albuterol (PROVENTIL HFA;VENTOLIN HFA) 108 (90 Base) MCG/ACT inhaler Inhale 2 puffs into the lungs every 6 (six) hours as needed for wheezing or shortness of breath. 02/19/19   Nance Pear, MD  amoxicillin-clavulanate (AUGMENTIN) 875-125 MG tablet Take 1 tablet by mouth 2 (two) times daily. 03/08/18    Triplett, Johnette Abraham B, FNP  azithromycin (ZITHROMAX Z-PAK) 250 MG tablet Take 2 tablets (500 mg) on  Day 1,  followed by 1 tablet (250 mg) once daily on Days 2 through 5. 05/14/18   Laban Emperor, PA-C  benzonatate (TESSALON PERLES) 100 MG capsule Take 1 capsule (100 mg total) by mouth every 6 (six) hours as needed for cough. 02/19/19 02/19/20  Nance Pear, MD  HYDROcodone-acetaminophen (NORCO/VICODIN) 5-325 MG per tablet Take 1 tablet by mouth every 4 (four) hours as needed for moderate pain. Patient not taking: Reported on 12/25/2017 08/13/15   Johnn Hai, PA-C  ibuprofen (ADVIL,MOTRIN) 200 MG tablet Take 200-400 mg by mouth every 6 (six) hours as needed.    [provider]  ibuprofen (ADVIL,MOTRIN) 800 MG tablet Take 1 tablet (800 mg total) by mouth every 8 (eight) hours as needed. 12/25/17   Earleen Newport, MD  ketorolac (TORADOL) 10 MG tablet Take 1 tablet (10 mg total) by mouth every 6 (six) hours as needed. 03/28/18   Laban Emperor, PA-C  meloxicam (MOBIC) 15 MG tablet Take 1 tablet (15 mg total) by mouth daily. 05/31/19   Jaydee Ingman, Charline Bills, PA-C  methocarbamol (ROBAXIN) 500 MG tablet Take 1 tablet (500 mg total) by mouth at bedtime. 05/31/19   Catina Nuss, Charline Bills, PA-C  penicillin v potassium (VEETID) 500 MG tablet Take 1 tablet (500 mg total) by mouth 4 (four) times daily. Patient not taking: Reported on 12/25/2017 09/01/17   Harvest Dark,  MD  predniSONE (DELTASONE) 10 MG tablet Take 5 tablets (50 mg total) by mouth daily. 03/08/18   Triplett, Johnette Abraham B, FNP  predniSONE (STERAPRED UNI-PAK 21 TAB) 10 MG (21) TBPK tablet Per packaging instructions 02/19/19   Nance Pear, MD  traMADol (ULTRAM) 50 MG tablet Take 1 tablet (50 mg total) by mouth every 6 (six) hours as needed. Patient not taking: Reported on 12/25/2017 09/01/17   Harvest Dark, MD    Allergies Eggs or egg-derived products, Lemon oil, Lime oil, and Sulfa antibiotics  No family history on  file.  Social History Social History   Tobacco Use  . Smoking status: Current Every Day Smoker    Packs/day: 0.50    Types: Cigarettes  . Smokeless tobacco: Never Used  Substance Use Topics  . Alcohol use: No  . Drug use: No     Review of Systems  Constitutional: No fever/chills Eyes: No visual changes. No discharge ENT: No upper respiratory complaints. Cardiovascular: no chest pain. Respiratory: no cough. No SOB. Gastrointestinal: No abdominal pain.  No nausea, no vomiting.  No diarrhea.  No constipation. Genitourinary: Negative for dysuria. No hematuria Musculoskeletal: Positive for posterior pelvic, bilateral hip pain. Skin: Negative for rash, abrasions, lacerations, ecchymosis. Neurological: Negative for headaches, focal weakness or numbness. 10-point ROS otherwise negative.  ____________________________________________   PHYSICAL EXAM:  VITAL SIGNS: ED Triage Vitals  Enc Vitals Group     BP 05/31/19 1022 (!) 153/87     Pulse Rate 05/31/19 1022 85     Resp 05/31/19 1022 16     Temp 05/31/19 1022 98.1 F (36.7 C)     Temp Source 05/31/19 1022 Oral     SpO2 05/31/19 1022 95 %     Weight 05/31/19 1023 145 lb (65.8 kg)     Height 05/31/19 1023 5' (1.524 m)     Head Circumference --      Peak Flow --      Pain Score 05/31/19 1022 10     Pain Loc --      Pain Edu? --      Excl. in [place]? --      Constitutional: Alert and oriented. Well appearing and in no acute distress. Eyes: Conjunctivae are normal. PERRL. EOMI. Head: Atraumatic. Neck: No stridor.    Cardiovascular: Normal rate, regular rhythm. Normal S1 and S2.  Good peripheral circulation. Respiratory: Normal respiratory effort without tachypnea or retractions. Lungs CTAB. Good air entry to the bases with no decreased or absent breath sounds. Musculoskeletal: Full range of motion to all extremities. No gross deformities appreciated.  Visualization of the pelvis and bilateral hips reveals no gross edema,  erythema or deformity.  Full range of motion to bilateral hip joints.  Patient is minimally tender to palpation over the sacrum, tender to palpation over both lateral hips.  Patient is specifically tender to palpation over the greater trochanter region bilaterally.  No other tenderness to palpation of the osseous structures of the hip.  Examination of the bilateral knees is unremarkable.  Dorsalis pedis pulse intact bilateral lower extremities.  Sensation intact and equal bilateral lower extremities. Neurologic:  Normal speech and language. No gross focal neurologic deficits are appreciated.  Skin:  Skin is warm, dry and intact. No rash noted. Psychiatric: Mood and affect are normal. Speech and behavior are normal. Patient exhibits appropriate insight and judgement.   ____________________________________________   LABS (all labs ordered are listed, but only abnormal results are displayed)  Labs Reviewed - No data to  display ____________________________________________  EKG   ____________________________________________  RADIOLOGY   No results found.  ____________________________________________    PROCEDURES  Procedure(s) performed:    Procedures    Medications  meloxicam (MOBIC) tablet 15 mg (has no administration in time range)     ____________________________________________   INITIAL IMPRESSION / ASSESSMENT AND PLAN / ED COURSE  Pertinent labs & imaging results that were available during my care of the patient were reviewed by me and considered in my medical decision making (see chart for details).  Review of the [place] CSRS was performed in accordance of the [place] prior to dispensing any controlled drugs.           Patient's diagnosis is consistent with bursitis.  Patient presented to the emergency department with bilateral hip pain after slipping in the shower approximately 3 weeks ago.  Patient has had ongoing worsening pain since then.  No frank lumbar back  pain.  No numbness or tingling in bilateral feet.  No history of same.  Exam is most consistent with bursitis from excessive stretch of the hip joints.  At this time no indication for imaging.  Patient will be placed on meloxicam, nightly Robaxin..  Follow-up with primary care or orthopedics as needed.  Patient is given ED precautions to return to the ED for any worsening or new symptoms.     ____________________________________________  FINAL CLINICAL IMPRESSION(S) / ED DIAGNOSES  Final diagnoses:  Trochanteric bursitis of both hips      NEW MEDICATIONS STARTED DURING THIS VISIT:  ED Discharge Orders         Ordered    meloxicam (MOBIC) 15 MG tablet  Daily     05/31/19 1335    methocarbamol (ROBAXIN) 500 MG tablet  Daily at bedtime     05/31/19 1335              This chart was dictated using voice recognition software/Dragon. Despite best efforts to proofread, errors can occur which can change the meaning. Any change was purely unintentional.    Darletta Moll, PA-C 05/31/19 1336    Arta Silence, MD 05/31/19 1345

## 2019-11-10 ENCOUNTER — Emergency Department: Payer: Self-pay

## 2019-11-10 DIAGNOSIS — R079 Chest pain, unspecified: Secondary | ICD-10-CM | POA: Insufficient documentation

## 2019-11-10 DIAGNOSIS — Z5321 Procedure and treatment not carried out due to patient leaving prior to being seen by health care provider: Secondary | ICD-10-CM | POA: Insufficient documentation

## 2019-11-10 LAB — COMPREHENSIVE METABOLIC PANEL
ALT: 16 U/L (ref 0–44)
AST: 15 U/L (ref 15–41)
Albumin: 4.3 g/dL (ref 3.5–5.0)
Alkaline Phosphatase: 85 U/L (ref 38–126)
Anion gap: 10 (ref 5–15)
BUN: 10 mg/dL (ref 6–20)
CO2: 25 mmol/L (ref 22–32)
Calcium: 9.2 mg/dL (ref 8.9–10.3)
Chloride: 99 mmol/L (ref 98–111)
Creatinine, Ser: 0.62 mg/dL (ref 0.44–1.00)
GFR calc Af Amer: >60 mL/min (ref >60)
GFR calc non Af Amer: >60 mL/min (ref >60)
Glucose, Bld: 133 mg/dL — ABNORMAL HIGH (ref 70–99)
Potassium: 3.3 mmol/L — ABNORMAL LOW (ref 3.5–5.1)
Sodium: 134 mmol/L — ABNORMAL LOW (ref 135–145)
Total Bilirubin: 0.7 mg/dL (ref 0.3–1.2)
Total Protein: 8.2 g/dL — ABNORMAL HIGH (ref 6.5–8.1)

## 2019-11-10 LAB — TROPONIN I (HIGH SENSITIVITY): Troponin I (High Sensitivity): 6 ng/L (ref <18)

## 2019-11-10 LAB — CBC
HCT: 40.2 % (ref 36.0–46.0)
Hemoglobin: 13.9 g/dL (ref 12.0–15.0)
MCH: 31.1 pg (ref 26.0–34.0)
MCHC: 34.6 g/dL (ref 30.0–36.0)
MCV: 89.9 fL (ref 80.0–100.0)
Platelets: 325 10*3/uL (ref 150–400)
RBC: 4.47 MIL/uL (ref 3.87–5.11)
RDW: 12.2 % (ref 11.5–15.5)
WBC: 12.4 10*3/uL — ABNORMAL HIGH (ref 4.0–10.5)
nRBC: 0 % (ref 0.0–0.2)

## 2019-11-10 NOTE — ED Triage Notes (Signed)
Patient seen last night at [facility] and diagnosed with bacterial infection and was told it was strep. Patient here today because she is having pains in her chest and states "I think I'm having another stroke." Patient without facial droop, slurred speech. Has equally strong grips. Able to speak in complete sentences without difficulty. Articulates words very well.

## 2019-11-11 ENCOUNTER — Telehealth: Payer: Self-pay | Admitting: Emergency Medicine

## 2019-11-11 ENCOUNTER — Emergency Department
Admission: EM | Admit: 2019-11-11 | Discharge: 2019-11-11 | Disposition: A | Discharge status: Home / Self Care | Payer: Self-pay | Attending: Emergency Medicine | Admitting: Emergency Medicine

## 2019-11-11 NOTE — Telephone Encounter (Signed)
Called patient due to lwot to inquire about condition and follow up plans. She says she is some better.  She has not contacted her doctor yet.  She agrees to do that and understands that her ct and lab work are available for their review.

## 2020-05-11 ENCOUNTER — Ambulatory Visit (LOCAL_COMMUNITY_HEALTH_CENTER): Payer: Self-pay

## 2020-05-11 ENCOUNTER — Other Ambulatory Visit: Payer: Self-pay

## 2020-05-11 DIAGNOSIS — Z111 Encounter for screening for respiratory tuberculosis: Secondary | ICD-10-CM

## 2020-05-14 ENCOUNTER — Ambulatory Visit (LOCAL_COMMUNITY_HEALTH_CENTER): Payer: Self-pay

## 2020-05-14 ENCOUNTER — Other Ambulatory Visit: Payer: Self-pay

## 2020-05-14 DIAGNOSIS — Z111 Encounter for screening for respiratory tuberculosis: Secondary | ICD-10-CM

## 2020-05-14 LAB — TB SKIN TEST
Induration: 0 mm
TB Skin Test: NEGATIVE

## 2021-02-28 IMAGING — CT CT HEAD W/O CM
3 series · 15 of 46 positions shown, 18 images · non-contrast
Comparison: None.

CLINICAL DATA: Headache

EXAM:
CT HEAD WITHOUT CONTRAST
TECHNIQUE: Contiguous axial images were obtained from the base of the skull
through the vertex without intravenous contrast.

[Series 3: head wo · axial · 0.42mm/px · z∈[+258,+378]mm · 9 of 29 slices shown, 12 images]
[im 3/29  brain]
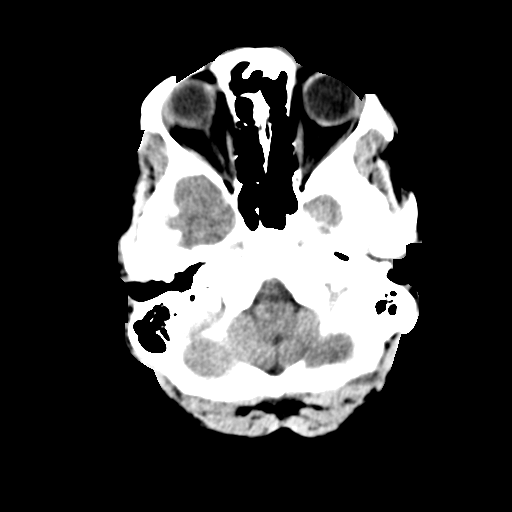
[im 3/29  bone]
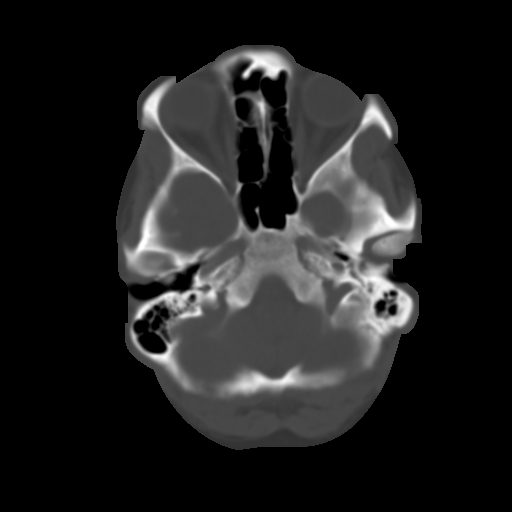
[im 6/29  brain]
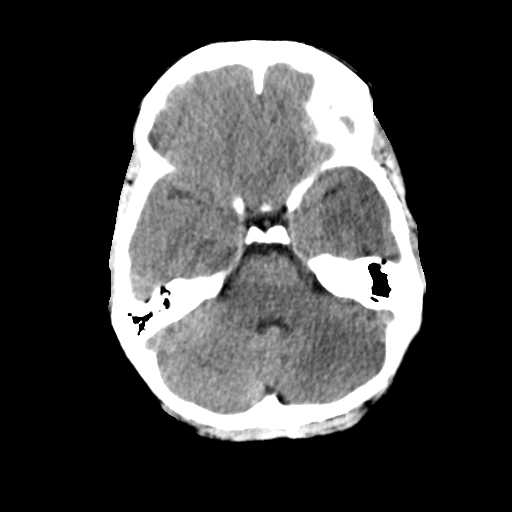
[im 9/29  brain]
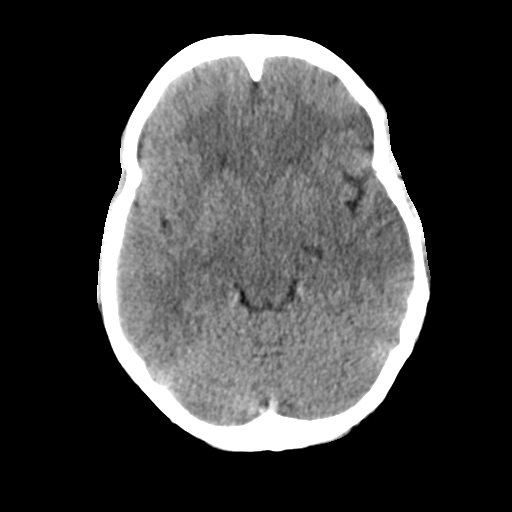
[im 12/29  brain]
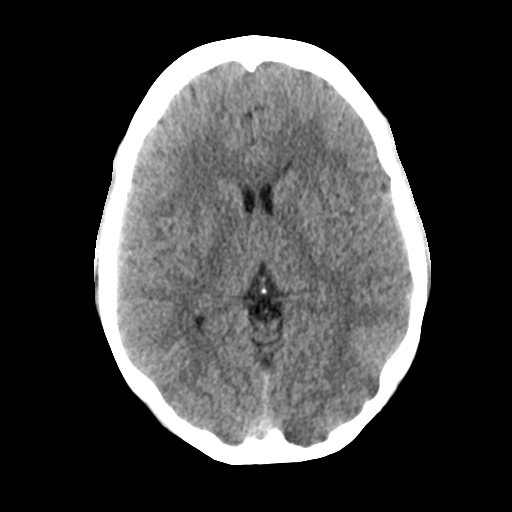
[im 15/29  brain]
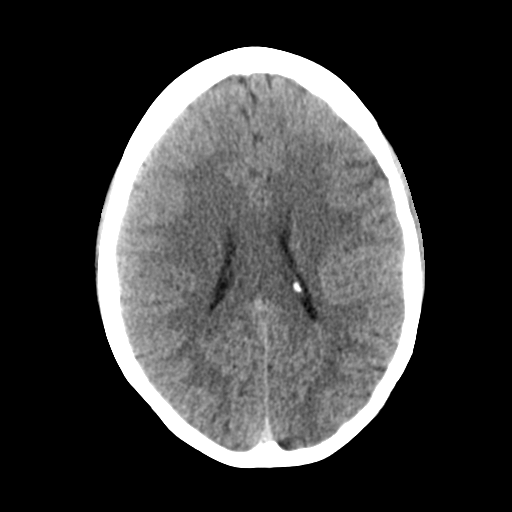
[im 15/29  bone]
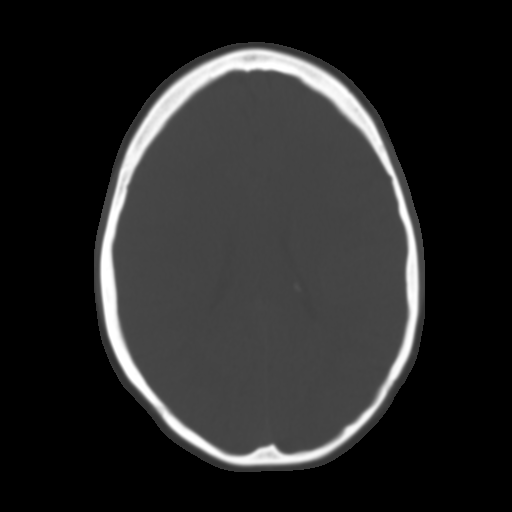
[im 18/29  brain]
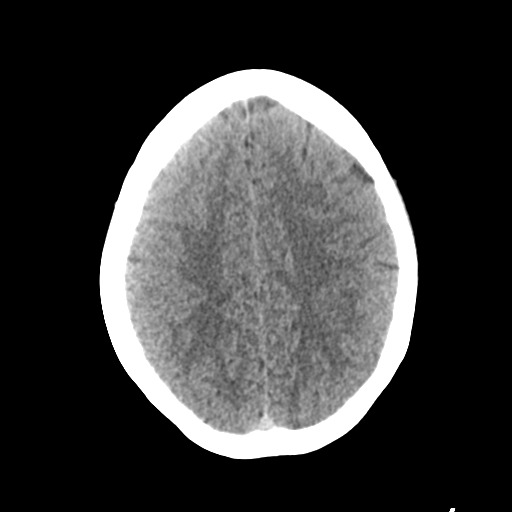
[im 21/29  brain]
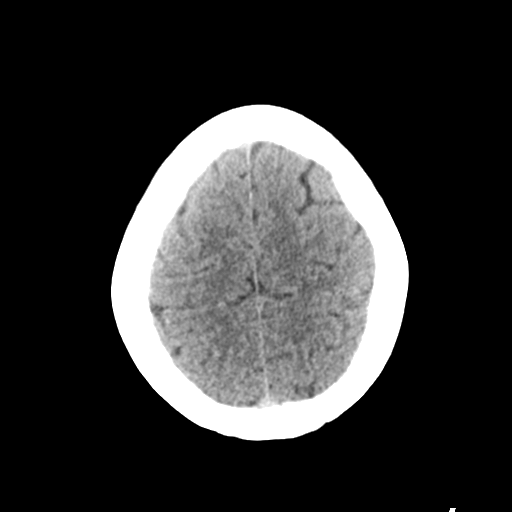
[im 24/29  brain]
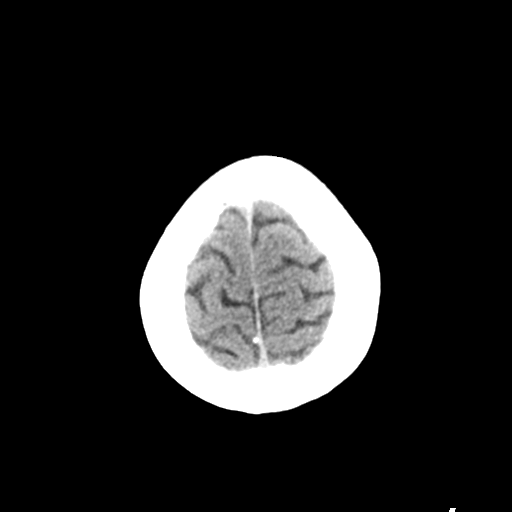
[im 27/29  brain]
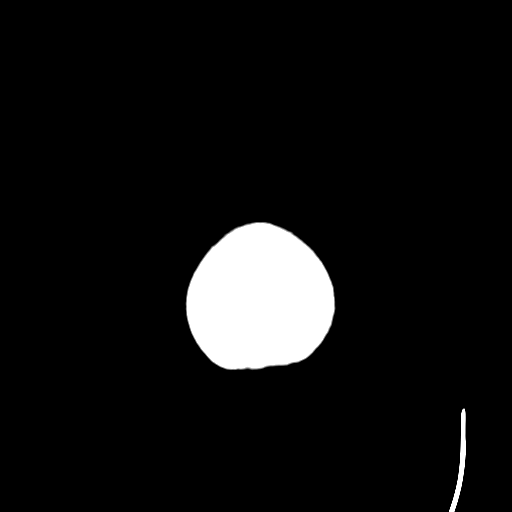
[im 27/29  bone]
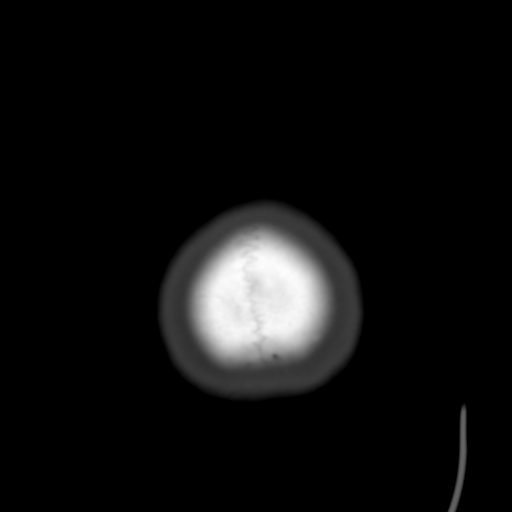

[Series 4: coronal soft tissue · coronal · 0.28mm/px · 3 of 64 slices shown]
[im 22/64  brain]
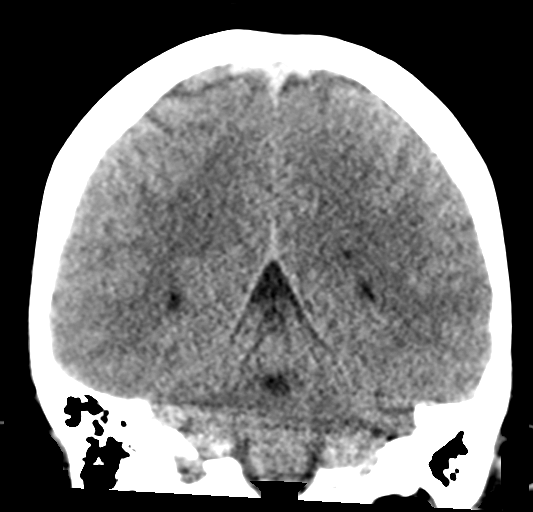
[im 29/64  brain]
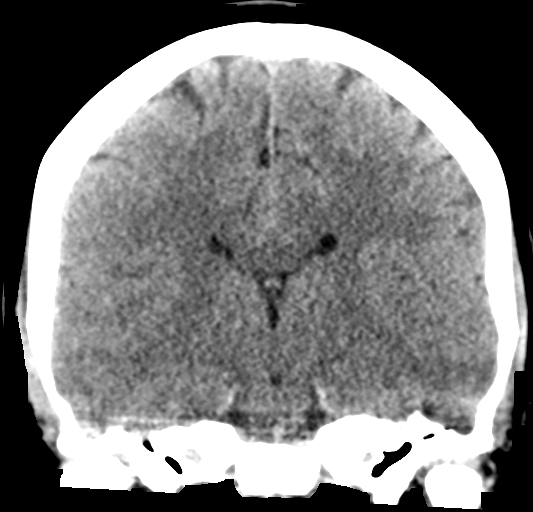
[im 36/64  brain]
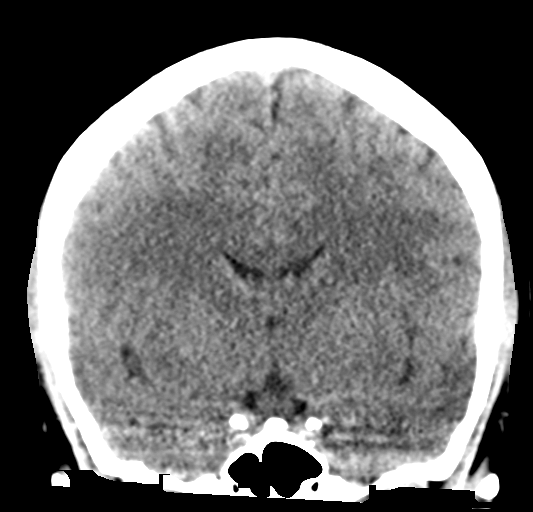

[Series 5: sagittal soft tissue · sagittal · 0.28mm/px · 3 of 51 slices shown]
[im 17/51  brain]
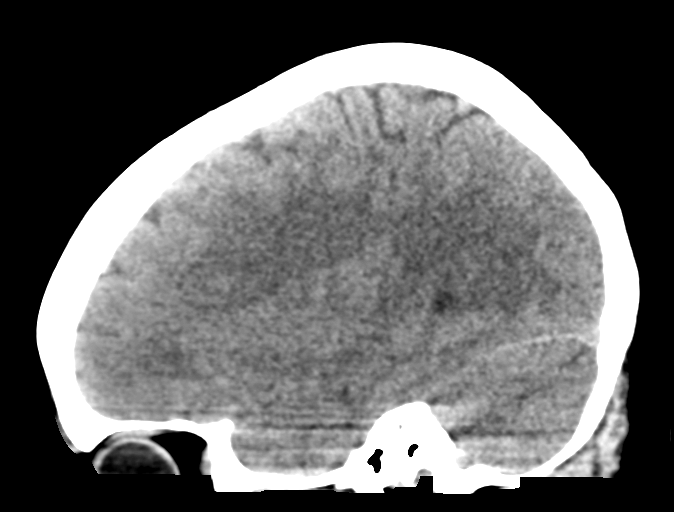
[im 26/51  brain]
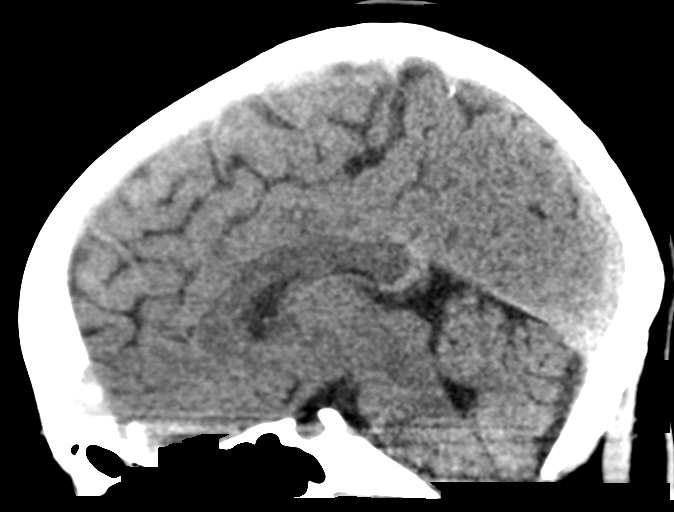
[im 34/51  brain]
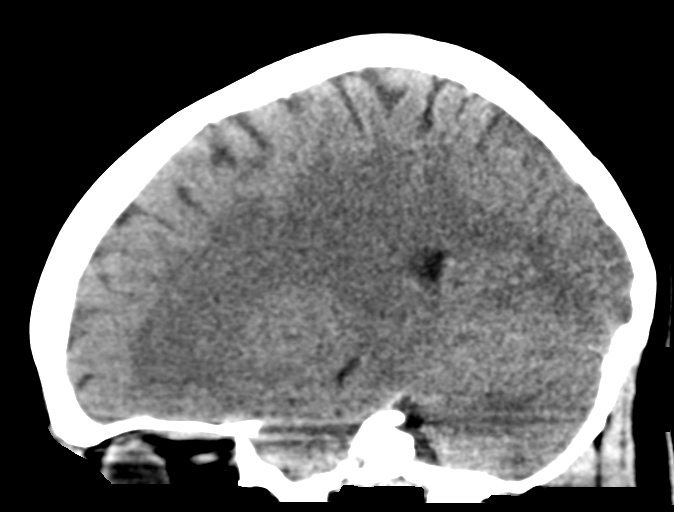

[15 of 46 positions shown; findings below may reference images not displayed]

FINDINGS: Brain: There is no acute intracranial abnormality. There is
decreased attenuation involving the anterior left temporal lobe, low
left frontal lobe and the posterior left occipital lobes. This is
likely secondary to beam hardening artifact. There is no midline
shift. No mass effect. There is a possible low-attenuation area
involving the inferior left cerebellum, felt to be artifactual.

Vascular: No hyperdense vessel or unexpected calcification.

Skull: Normal. Negative for fracture or focal lesion.

Sinuses/Orbits: No acute finding.

Other: None.
IMPRESSION: No acute intracranial abnormality.

## 2021-03-23 ENCOUNTER — Emergency Department
Admission: EM | Admit: 2021-03-23 | Discharge: 2021-03-23 | Disposition: A | Discharge status: Home / Self Care | Payer: Self-pay | Attending: Emergency Medicine | Admitting: Emergency Medicine

## 2021-03-23 ENCOUNTER — Other Ambulatory Visit: Payer: Self-pay

## 2021-03-23 DIAGNOSIS — J069 Acute upper respiratory infection, unspecified: Secondary | ICD-10-CM | POA: Insufficient documentation

## 2021-03-23 DIAGNOSIS — H66004 Acute suppurative otitis media without spontaneous rupture of ear drum, recurrent, right ear: Secondary | ICD-10-CM | POA: Insufficient documentation

## 2021-03-23 DIAGNOSIS — F17210 Nicotine dependence, cigarettes, uncomplicated: Secondary | ICD-10-CM | POA: Insufficient documentation

## 2021-03-23 MED ORDER — AMOXICILLIN-POT CLAVULANATE 875-125 MG PO TABS: 1.0000 | ORAL_TABLET | Freq: Two times a day (BID) | ORAL | 0 refills | Status: AC

## 2021-03-23 NOTE — ED Triage Notes (Signed)
Pt c/o right ear and throat pain for the past couple of days, pt just came from [facility] for a covid test and is awaiting results, states he was around her grand daughter recent who had similar sx and a fever

## 2021-03-23 NOTE — ED Provider Notes (Signed)
[facility] Emergency Department Provider Note   ____________________________________________   Event Date/Time   First MD Initiated Contact with Patient 03/23/21 1021     (approximate)  I have reviewed the triage vital signs and the nursing notes.   HISTORY  Chief Complaint URI    HPI Lindsey Acosta is a 45 y.o. female with below stated past medical history and significant for recurrent otitis media who presents for 2 days of throat pain and 1 day of right ear pain.  Patient states she was also recently tested at "health at work" for [place] but is still awaiting the results.  Patient complains of aching right ear pain that radiates down into her jaw and is worsened with any palpitation or pressure on the right side of her head.  Patient does endorse associated subjective fevers and chills.  Patient does endorse a recent sick contact.  Patient denies any recent travel.  Patient currently denies any vision changes, tinnitus, difficulty speaking, facial droop, chest pain, shortness of breath, abdominal pain, nausea/vomiting/diarrhea, dysuria, or weakness/numbness/paresthesias in any extremity         Past Medical History:  Diagnosis Date  . Cellulitis   . Fibroid uterus   . Fibroids   . 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   Past Surgical History:  Procedure Laterality Date  . CHOLECYSTECTOMY    . TUBAL LIGATION      Prior to Admission medications   Medication Sig Start Date End Date Taking? Authorizing Provider  amoxicillin-clavulanate (AUGMENTIN) 875-125 MG tablet Take 1 tablet by mouth 2 (two) times daily for 7 days. 03/23/21 03/30/21 Yes Jeneva Schweizer, Vista Lawman, MD  acetaminophen-codeine (TYLENOL #3) 300-30 MG tablet Take 1 tablet by mouth every 6 (six) hours as needed for moderate pain. 03/08/18   Triplett, Johnette Abraham B, FNP  albuterol (PROVENTIL HFA;VENTOLIN HFA) 108 (90 Base) MCG/ACT inhaler Inhale 2 puffs into  the lungs every 6 (six) hours as needed for wheezing or shortness of breath. Patient not taking: Reported on 12/25/2017 05/26/16   Mortimer Fries, PA-C  albuterol (PROVENTIL HFA;VENTOLIN HFA) 108 (90 Base) MCG/ACT inhaler Inhale 2 puffs into the lungs every 6 (six) hours as needed for wheezing or shortness of breath. 02/19/19   Nance Pear, MD  amoxicillin-clavulanate (AUGMENTIN) 875-125 MG tablet Take 1 tablet by mouth 2 (two) times daily. 03/08/18   Triplett, Cari B, FNP  ibuprofen (ADVIL,MOTRIN) 200 MG tablet Take 200-400 mg by mouth every 6 (six) hours as needed.    [provider]  ibuprofen (ADVIL,MOTRIN) 800 MG tablet Take 1 tablet (800 mg total) by mouth every 8 (eight) hours as needed. 12/25/17   Earleen Newport, MD  predniSONE (DELTASONE) 10 MG tablet Take 5 tablets (50 mg total) by mouth daily. 03/08/18   Triplett, Johnette Abraham B, FNP    Allergies Eggs or egg-derived products, Lemon oil, Lime oil, and Sulfa antibiotics  No family history on file.  Social History Social History   Tobacco Use  . Smoking status: Current Every Day Smoker    Packs/day: 0.50    Types: Cigarettes  . Smokeless tobacco: Never Used  Substance Use Topics  . Alcohol use: No  . Drug use: No    Review of Systems Constitutional: No fever/chills Eyes: No visual changes. ENT: Endorses sore throat.  Endorses right ear pain Cardiovascular: Denies chest pain. Respiratory: Denies shortness of breath. Gastrointestinal: No abdominal pain.  No nausea, no vomiting.  No diarrhea. Genitourinary: Negative for dysuria. Musculoskeletal: Negative for acute arthralgias Skin: Negative for rash. Neurological: Negative for headaches, weakness/numbness/paresthesias in any extremity Psychiatric: Negative for suicidal ideation/homicidal ideation   ____________________________________________   PHYSICAL EXAM:  VITAL SIGNS: ED Triage Vitals  Enc Vitals Group     BP 03/23/21 0950 137/86     Pulse Rate  03/23/21 0950 78     Resp 03/23/21 0950 18     Temp 03/23/21 0952 98.1 F (36.7 C)     Temp Source 03/23/21 0950 Oral     SpO2 03/23/21 0950 100 %     Weight 03/23/21 0950 165 lb (74.8 kg)     Height 03/23/21 0950 5' (1.524 m)     Head Circumference --      Peak Flow --      Pain Score 03/23/21 0950 7     Pain Loc --      Pain Edu? --      Excl. in [place]? --    Constitutional: Alert and oriented. Well appearing and in no acute distress. Eyes: Conjunctivae are normal. PERRL. Ears: Left TM pearly with all structures appreciated, right TM with a small amount of purulent material at the base of the TM \\Head : Atraumatic. Nose: No congestion/rhinnorhea. Mouth/Throat: Mucous membranes are moist. Neck: No stridor Cardiovascular: Grossly normal heart sounds.  Good peripheral circulation. Respiratory: Normal respiratory effort.  No retractions. Gastrointestinal: Soft and nontender. No distention. Musculoskeletal: No obvious deformities Neurologic:  Normal speech and language. No gross focal neurologic deficits are appreciated. Skin:  Skin is warm and dry. No rash noted. Psychiatric: Mood and affect are normal. Speech and behavior are normal.  ____________________________________________   LABS (all labs ordered are listed, but only abnormal results are displayed)  Labs Reviewed - No data to display    PROCEDURES  Procedure(s) performed (including Critical Care):  Procedures   ____________________________________________   INITIAL IMPRESSION / ASSESSMENT AND PLAN / ED COURSE  As part of my medical decision making, I reviewed the following data within the [facility] notes reviewed and incorporated, Old chart reviewed and Notes from prior ED visits reviewed and incorporated     Exam and history most consistent with AOM. I have a low suspicion at this time for mastoiditis, malignant otitis externa, herpes, retained foreign body.  Rx: Wait-and-see  antibiotic prescription Augmentin BID 7 days Disposition: Discharge. If symptoms worsen or persist for 48-72 then pt to fill the prescription. Cautious return precautions discussed w/ full understanding.    ____________________________________________   FINAL CLINICAL IMPRESSION(S) / ED DIAGNOSES  Final diagnoses:  Upper respiratory tract infection, unspecified type  Recurrent acute suppurative otitis media of right ear without spontaneous rupture of tympanic membrane     ED Discharge Orders         Ordered    amoxicillin-clavulanate (AUGMENTIN) 875-125 MG tablet  2 times daily        03/23/21 1040           Note:  This document was prepared using Dragon voice recognition software and may include unintentional dictation errors.   Naaman Plummer, MD 03/23/21 1045

## 2021-03-23 NOTE — ED Notes (Signed)
See triage note  Presents with pain from right ear into right jaw line  States it started couple of days ago  Afebrile on arrival  States she had a COVID test and waiting the results

## 2021-10-15 ENCOUNTER — Encounter: Payer: Self-pay | Admitting: Emergency Medicine

## 2021-10-15 ENCOUNTER — Other Ambulatory Visit: Payer: Self-pay

## 2021-10-15 ENCOUNTER — Emergency Department
Admission: EM | Admit: 2021-10-15 | Discharge: 2021-10-15 | Disposition: A | Discharge status: Home / Self Care | Payer: Self-pay | Attending: Emergency Medicine | Admitting: Emergency Medicine

## 2021-10-15 DIAGNOSIS — J029 Acute pharyngitis, unspecified: Secondary | ICD-10-CM | POA: Insufficient documentation

## 2021-10-15 DIAGNOSIS — H6981 Other specified disorders of Eustachian tube, right ear: Secondary | ICD-10-CM

## 2021-10-15 DIAGNOSIS — F17210 Nicotine dependence, cigarettes, uncomplicated: Secondary | ICD-10-CM | POA: Insufficient documentation

## 2021-10-15 DIAGNOSIS — H6991 Unspecified Eustachian tube disorder, right ear: Secondary | ICD-10-CM | POA: Insufficient documentation

## 2021-10-15 MED ORDER — PREDNISONE 10 MG PO TABS: ORAL_TABLET | ORAL | 0 refills | Status: DC

## 2021-10-15 MED ORDER — AMOXICILLIN-POT CLAVULANATE 875-125 MG PO TABS: 1.0000 | ORAL_TABLET | Freq: Two times a day (BID) | ORAL | 0 refills | Status: AC

## 2021-10-15 NOTE — ED Notes (Signed)
See triage note  presents with right ear pain for 1-2 days   states pain is moving into throat  states had fever at home but afebrile on arrival

## 2021-10-15 NOTE — ED Triage Notes (Signed)
Pt comes into the ED via POV c/o right otalgia and sore throat that started last night.  Pt admits to waking up a with a fever, but she denies that she has one now.  Pt in NAD with even and unlabored respirations.

## 2021-10-15 NOTE — Discharge Instructions (Addendum)
Follow-up with your primary care provider if any continued problems or not improving you should follow-up with Dr. Tami Ribas and his contact information and phone number listed on your discharge papers.  Begin taking antibiotics and prednisone.  Continue using your Zyrtec over-the-counter.  Increase fluids.  You may also take Tylenol or ibuprofen as needed for ear pain.

## 2021-10-15 NOTE — ED Provider Notes (Signed)
[facility] Emergency Department Provider Note  ____________________________________________   Event Date/Time   First MD Initiated Contact with Patient 10/15/21 1016     (approximate)  I have reviewed the triage vital signs and the nursing notes.   HISTORY  Chief Complaint Otalgia and Sore Throat   HPI Lindsey Acosta is a 45 y.o. female presents to the ED with complaint of right ear pain and sore throat that started last evening.  Patient states that she woke this morning with a fever.  Currently she denies any cough, nausea, vomiting or diarrhea.  She denies any known COVID or influenza exposure.  She states that she has had problems with her ears in the past.  She rates her pain as an 8 out of 10.         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   Past Surgical History:  Procedure Laterality Date   CHOLECYSTECTOMY     TUBAL LIGATION      Prior to Admission medications   Medication Sig Start Date End Date Taking? Authorizing Provider  amoxicillin-clavulanate (AUGMENTIN) 875-125 MG tablet Take 1 tablet by mouth 2 (two) times daily for 7 days. 10/15/21 10/22/21 Yes Samhita Kretsch L, PA-C  predniSONE (DELTASONE) 10 MG tablet Take 6 tablets  today, on day 2 take 5 tablets, day 3 take 4 tablets, day 4 take 3 tablets, day 5 take  2 tablets and 1 tablet the last day 10/15/21  Yes Letitia Neri L, PA-C  albuterol (PROVENTIL HFA;VENTOLIN HFA) 108 (90 Base) MCG/ACT inhaler Inhale 2 puffs into the lungs every 6 (six) hours as needed for wheezing or shortness of breath. Patient not taking: Reported on 12/25/2017 05/26/16   Mortimer Fries, PA-C  albuterol (PROVENTIL HFA;VENTOLIN HFA) 108 (90 Base) MCG/ACT inhaler Inhale 2 puffs into the lungs every 6 (six) hours as needed for wheezing or shortness of breath. 02/19/19   Nance Pear, MD  ibuprofen (ADVIL,MOTRIN) 800 MG tablet Take 1 tablet (800 mg total) by mouth every 8 (eight) hours as needed. 12/25/17   Earleen Newport, MD    Allergies Eggs or egg-derived products, Lemon oil, Lime oil, and Sulfa antibiotics  History reviewed. No pertinent family history.  Social History Social History   Tobacco Use   Smoking status: Every Day    Packs/day: 0.50    Types: Cigarettes   Smokeless tobacco: Never  Substance Use Topics   Alcohol use: No   Drug use: No    Review of Systems Constitutional: Positive fever/chills Eyes: No visual changes. ENT: Positive right ear pain and sore throat. Cardiovascular: Denies chest pain. Respiratory: Denies shortness of breath. Gastrointestinal: No abdominal pain.  No nausea, no vomiting.  No diarrhea.   Genitourinary: Negative for dysuria. Musculoskeletal: Negative for musculoskeletal pain. Skin: Negative for rash. Neurological: Negative for headaches, focal weakness or numbness. ____________________________________________   PHYSICAL EXAM:  VITAL SIGNS: ED Triage Vitals  Enc Vitals Group     BP 10/15/21 0931 (!) 145/85     Pulse Rate 10/15/21 0931 85     Resp 10/15/21 0931 20     Temp 10/15/21 0931 98.3 F (36.8 C)     Temp Source 10/15/21 0931 Oral     SpO2 10/15/21 0931 100 %     Weight 10/15/21 0931 155 lb (  70.3 kg)     Height 10/15/21 0931 5' (1.524 m)     Head Circumference --      Peak Flow --      Pain Score 10/15/21 0936 8     Pain Loc --      Pain Edu? --      Excl. in [place]? --     Constitutional: Alert and oriented. Well appearing and in no acute distress. Eyes: Conjunctivae are normal. PERRL. EOMI. Head: Atraumatic. Nose: No congestion/rhinnorhea. Ears: EACs are clear.  TMs are dull without erythema or injection.  Poor light reflex is noted. Mouth/Throat: Mucous membranes are moist.  Oropharynx non-erythematous. Neck: No stridor.   Hematological/Lymphatic/Immunilogical: No cervical  lymphadenopathy. Cardiovascular: Normal rate, regular rhythm. Grossly normal heart sounds.  Good peripheral circulation. Respiratory: Normal respiratory effort.  No retractions. Lungs CTAB. Gastrointestinal: Soft and nontender. No distention.  Musculoskeletal: Moves upper and lower extremities any difficulty and normal gait was noted. Neurologic:  Normal speech and language. No gross focal neurologic deficits are appreciated. No gait instability. Skin:  Skin is warm, dry and intact. No rash noted. Psychiatric: Mood and affect are normal. Speech and behavior are normal.  ____________________________________________   LABS (all labs ordered are listed, but only abnormal results are displayed)  Labs Reviewed - No data to display ____________________________________________  PROCEDURES  Procedure(s) performed (including Critical Care):  Procedures   ____________________________________________   INITIAL IMPRESSION / ASSESSMENT AND PLAN / ED COURSE  As part of my medical decision making, I reviewed the following data within the electronic MEDICAL RECORD NUMBER Notes from prior ED visits  45 year old female presents to the ED with complaint of right otalgia and sore throat that began last evening.  Patient states she woke this morning with a low-grade temp which was not noted in triage.  Patient states that she has problems with her ears and in the past has been seen in the emergency department.  Is also felt that she has some eustachian tube dysfunction which causes her ear pain.  Currently there was no erythema but right TM had poor light reflex and moderate tenderness during exam.  Patient was started on Augmentin 875 twice daily and prednisone 6-day taper.  She is encouraged to restart her Zyrtec which she takes for allergies.  She will follow-up with her PCP or Dr. Tami Ribas who is on-call for ENT if any continued problems or not  improving.  ____________________________________________   FINAL CLINICAL IMPRESSION(S) / ED DIAGNOSES  Final diagnoses:  Eustachian tube dysfunction, right     ED Discharge Orders          Ordered    amoxicillin-clavulanate (AUGMENTIN) 875-125 MG tablet  2 times daily        10/15/21 1048    predniSONE (DELTASONE) 10 MG tablet        10/15/21 1048             Note:  This document was prepared using Dragon voice recognition software and may include unintentional dictation errors.    Johnn Hai, PA-C 10/15/21 1142    Blake Divine, MD 10/15/21 [phone]

## 2022-04-23 ENCOUNTER — Other Ambulatory Visit: Payer: Self-pay

## 2022-04-23 ENCOUNTER — Emergency Department: Payer: Self-pay

## 2022-04-23 ENCOUNTER — Encounter: Payer: Self-pay | Admitting: Emergency Medicine

## 2022-04-23 ENCOUNTER — Emergency Department
Admission: EM | Admit: 2022-04-23 | Discharge: 2022-04-23 | Discharge status: Against Medical Advice | Payer: Self-pay | Attending: Emergency Medicine | Admitting: Emergency Medicine

## 2022-04-23 DIAGNOSIS — R531 Weakness: Secondary | ICD-10-CM | POA: Insufficient documentation

## 2022-04-23 DIAGNOSIS — Z5321 Procedure and treatment not carried out due to patient leaving prior to being seen by health care provider: Secondary | ICD-10-CM | POA: Insufficient documentation

## 2022-04-23 DIAGNOSIS — M79602 Pain in left arm: Secondary | ICD-10-CM | POA: Insufficient documentation

## 2022-04-23 DIAGNOSIS — R200 Anesthesia of skin: Secondary | ICD-10-CM | POA: Insufficient documentation

## 2022-04-23 DIAGNOSIS — M79605 Pain in left leg: Secondary | ICD-10-CM | POA: Insufficient documentation

## 2022-04-23 LAB — CBC
HCT: 40.7 % (ref 36.0–46.0)
Hemoglobin: 13.2 g/dL (ref 12.0–15.0)
MCH: 31 pg (ref 26.0–34.0)
MCHC: 32.4 g/dL (ref 30.0–36.0)
MCV: 95.5 fL (ref 80.0–100.0)
Platelets: 337 10*3/uL (ref 150–400)
RBC: 4.26 MIL/uL (ref 3.87–5.11)
RDW: 12.4 % (ref 11.5–15.5)
WBC: 8.8 10*3/uL (ref 4.0–10.5)
nRBC: 0 % (ref 0.0–0.2)

## 2022-04-23 LAB — DIFFERENTIAL
Abs Immature Granulocytes: 0.02 10*3/uL (ref 0.00–0.07)
Basophils Absolute: 0 10*3/uL (ref 0.0–0.1)
Basophils Relative: 1 %
Eosinophils Absolute: 0.1 10*3/uL (ref 0.0–0.5)
Eosinophils Relative: 2 %
Immature Granulocytes: 0 %
Lymphocytes Relative: 20 %
Lymphs Abs: 1.8 10*3/uL (ref 0.7–4.0)
Monocytes Absolute: 0.6 10*3/uL (ref 0.1–1.0)
Monocytes Relative: 7 %
Neutro Abs: 6.2 10*3/uL (ref 1.7–7.7)
Neutrophils Relative %: 70 %

## 2022-04-23 LAB — COMPREHENSIVE METABOLIC PANEL
ALT: 12 U/L (ref 0–44)
AST: 17 U/L (ref 15–41)
Albumin: 3.9 g/dL (ref 3.5–5.0)
Alkaline Phosphatase: 78 U/L (ref 38–126)
Anion gap: 7 (ref 5–15)
BUN: 9 mg/dL (ref 6–20)
CO2: 23 mmol/L (ref 22–32)
Calcium: 8.7 mg/dL — ABNORMAL LOW (ref 8.9–10.3)
Chloride: 104 mmol/L (ref 98–111)
Creatinine, Ser: 0.65 mg/dL (ref 0.44–1.00)
GFR, Estimated: >60 mL/min (ref >60)
Glucose, Bld: 114 mg/dL — ABNORMAL HIGH (ref 70–99)
Potassium: 3.9 mmol/L (ref 3.5–5.1)
Sodium: 134 mmol/L — ABNORMAL LOW (ref 135–145)
Total Bilirubin: 0.4 mg/dL (ref 0.3–1.2)
Total Protein: 7.3 g/dL (ref 6.5–8.1)

## 2022-04-23 LAB — PROTIME-INR
INR: 1 (ref 0.8–1.2)
Prothrombin Time: 13.3 seconds (ref 11.4–15.2)

## 2022-04-23 LAB — POC URINE PREG, ED: Preg Test, Ur: NEGATIVE

## 2022-04-23 LAB — CBG MONITORING, ED: Glucose-Capillary: 114 mg/dL — ABNORMAL HIGH (ref 70–99)

## 2022-04-23 LAB — APTT: aPTT: 29 seconds (ref 24–36)

## 2022-04-23 NOTE — ED Notes (Signed)
Called CT.....Marland KitchenMarland KitchenAlso, looking for her. Not found

## 2022-04-23 NOTE — ED Notes (Signed)
Sent rainbow to the lab. 

## 2022-04-23 NOTE — ED Triage Notes (Signed)
First Nurse Note: Pt via EMS from home. Pt c/o L arm pain for the past 2 weeks also endorse Grip strength on the L side is worse due to the pain. States that now its in her L leg pain and tingling. Pt has a hx of stroke 10 years from stress. Took 324 ASA. Pt is A&Ox4 and NAD  169/100 113 CBG 80 HR  100% on RA

## 2022-04-23 NOTE — ED Notes (Signed)
Pt not visualized in the lobby at this time.

## 2022-04-23 NOTE — ED Triage Notes (Signed)
Pt via EMS from work, pt c/o L arm numbness and weakness that started 2 weeks ago. Pt states that when she woke up this AM and had numbness in her L leg. Denies any facial numbness or palsy. Reports a TIA 10 years ago. Denies blood thinners. EMS reported that her grip strength on the L side was weaker, but pt states it hurt to squeeze it. Pt is A&Ox4 and NAD

## 2023-01-11 DIAGNOSIS — R911 Solitary pulmonary nodule: Secondary | ICD-10-CM | POA: Diagnosis not present

## 2023-01-11 DIAGNOSIS — M7070 Other bursitis of hip, unspecified hip: Secondary | ICD-10-CM | POA: Diagnosis not present

## 2023-01-11 DIAGNOSIS — E785 Hyperlipidemia, unspecified: Secondary | ICD-10-CM | POA: Diagnosis not present

## 2023-01-11 DIAGNOSIS — E669 Obesity, unspecified: Secondary | ICD-10-CM | POA: Diagnosis not present

## 2023-01-11 DIAGNOSIS — Z0000 Encounter for general adult medical examination without abnormal findings: Secondary | ICD-10-CM | POA: Diagnosis not present

## 2023-01-11 DIAGNOSIS — Z23 Encounter for immunization: Secondary | ICD-10-CM | POA: Diagnosis not present

## 2023-01-11 DIAGNOSIS — Z1389 Encounter for screening for other disorder: Secondary | ICD-10-CM | POA: Diagnosis not present

## 2023-01-11 DIAGNOSIS — Z130 Encounter for screening for diseases of the blood and blood-forming organs and certain disorders involving the immune mechanism: Secondary | ICD-10-CM | POA: Diagnosis not present

## 2023-01-11 DIAGNOSIS — N924 Excessive bleeding in the premenopausal period: Secondary | ICD-10-CM | POA: Diagnosis not present

## 2023-01-24 DIAGNOSIS — E785 Hyperlipidemia, unspecified: Secondary | ICD-10-CM | POA: Diagnosis not present

## 2023-01-24 DIAGNOSIS — Z1389 Encounter for screening for other disorder: Secondary | ICD-10-CM | POA: Diagnosis not present

## 2023-01-24 DIAGNOSIS — F17200 Nicotine dependence, unspecified, uncomplicated: Secondary | ICD-10-CM | POA: Diagnosis not present

## 2023-01-24 DIAGNOSIS — Z0000 Encounter for general adult medical examination without abnormal findings: Secondary | ICD-10-CM | POA: Diagnosis not present

## 2023-01-24 DIAGNOSIS — N924 Excessive bleeding in the premenopausal period: Secondary | ICD-10-CM | POA: Diagnosis not present

## 2023-03-01 DIAGNOSIS — R911 Solitary pulmonary nodule: Secondary | ICD-10-CM | POA: Diagnosis not present

## 2023-03-01 DIAGNOSIS — Z1231 Encounter for screening mammogram for malignant neoplasm of breast: Secondary | ICD-10-CM | POA: Diagnosis not present

## 2023-10-26 ENCOUNTER — Other Ambulatory Visit: Payer: Self-pay

## 2023-10-26 ENCOUNTER — Emergency Department: Payer: 59

## 2023-10-26 ENCOUNTER — Emergency Department
Admission: EM | Admit: 2023-10-26 | Discharge: 2023-10-26 | Disposition: A | Discharge status: Home / Self Care | Payer: 59 | Attending: Emergency Medicine | Admitting: Emergency Medicine

## 2023-10-26 DIAGNOSIS — J441 Chronic obstructive pulmonary disease with (acute) exacerbation: Secondary | ICD-10-CM | POA: Insufficient documentation

## 2023-10-26 DIAGNOSIS — R0602 Shortness of breath: Secondary | ICD-10-CM | POA: Diagnosis present

## 2023-10-26 LAB — BASIC METABOLIC PANEL
Anion gap: 10 (ref 5–15)
BUN: 7 mg/dL (ref 6–20)
CO2: 20 mmol/L — ABNORMAL LOW (ref 22–32)
Calcium: 8.7 mg/dL — ABNORMAL LOW (ref 8.9–10.3)
Chloride: 102 mmol/L (ref 98–111)
Creatinine, Ser: 0.75 mg/dL (ref 0.44–1.00)
GFR, Estimated: >60 mL/min (ref >60)
Glucose, Bld: 152 mg/dL — ABNORMAL HIGH (ref 70–99)
Potassium: 3.3 mmol/L — ABNORMAL LOW (ref 3.5–5.1)
Sodium: 132 mmol/L — ABNORMAL LOW (ref 135–145)

## 2023-10-26 LAB — CBC
HCT: 40.7 % (ref 36.0–46.0)
Hemoglobin: 13.6 g/dL (ref 12.0–15.0)
MCH: 31.1 pg (ref 26.0–34.0)
MCHC: 33.4 g/dL (ref 30.0–36.0)
MCV: 93.1 fL (ref 80.0–100.0)
Platelets: 295 10*3/uL (ref 150–400)
RBC: 4.37 MIL/uL (ref 3.87–5.11)
RDW: 12.6 % (ref 11.5–15.5)
WBC: 8.3 10*3/uL (ref 4.0–10.5)
nRBC: 0 % (ref 0.0–0.2)

## 2023-10-26 LAB — TROPONIN I (HIGH SENSITIVITY): Troponin I (High Sensitivity): 3 ng/L (ref <18)

## 2023-10-26 MED ORDER — AZITHROMYCIN 250 MG PO TABS: ORAL_TABLET | ORAL | 0 refills | Status: AC

## 2023-10-26 MED ORDER — IPRATROPIUM-ALBUTEROL 0.5-2.5 (3) MG/3ML IN SOLN
3.0000 mL | Freq: Once | RESPIRATORY_TRACT | Status: AC
  Administered 2023-10-26: 3 mL via RESPIRATORY_TRACT
  Filled 2023-10-26: qty 3

## 2023-10-26 MED ORDER — PREDNISONE 20 MG PO TABS
60.0000 mg | ORAL_TABLET | Freq: Once | ORAL | Status: AC
  Administered 2023-10-26: 60 mg via ORAL
  Filled 2023-10-26: qty 3

## 2023-10-26 MED ORDER — POTASSIUM CHLORIDE CRYS ER 20 MEQ PO TBCR
40.0000 meq | EXTENDED_RELEASE_TABLET | Freq: Once | ORAL | Status: AC
  Administered 2023-10-26: 40 meq via ORAL
  Filled 2023-10-26: qty 2

## 2023-10-26 MED ORDER — PREDNISONE 20 MG PO TABS: 40.0000 mg | ORAL_TABLET | Freq: Every day | ORAL | 0 refills | Status: AC

## 2023-10-26 MED ORDER — AMOXICILLIN-POT CLAVULANATE 875-125 MG PO TABS: 1.0000 | ORAL_TABLET | Freq: Two times a day (BID) | ORAL | 0 refills | Status: AC

## 2023-10-26 NOTE — Discharge Instructions (Addendum)
You do sound wheezing on examination so we are treating with some antibiotics and steroids to help with your symptoms.  You can try to use your nebulized albuterol every 6 hours at home and return to the ER if develop worsening shortness of breath or any other concerns

## 2023-10-26 NOTE — ED Notes (Signed)
Pt provided discharge instructions and prescription information. Pt was given the opportunity to ask questions and questions were answered.   

## 2023-10-26 NOTE — ED Triage Notes (Signed)
Pt states shortness of breath and cough for the last week and was seen by PCP and was given benzonatate and an albuterol inhaler. Pt states the shortness of breath gets worse when walking.

## 2023-10-26 NOTE — ED Provider Notes (Signed)
[facility] Provider Note    Event Date/Time   First MD Initiated Contact with Patient 10/26/23 0901     (approximate)   History   Shortness of Breath   HPI  Lindsey Acosta is a 47 y.o. female who is otherwise healthy but does report a history of COPD who comes in with concerns for shortness of breath and cough.  Patient reports having symptoms over the past 8 days.  She reports having productive mucus coming out that is nonbloody in nature.  She reports coughing significantly and having some shortness of breath with exertion.  She has been unable to work due to it.  She denies any risk factors for pulmonary embolism she reports having her tubes tied.  Denies any coughing up blood.  Physical Exam   Triage Vital Signs: ED Triage Vitals  Encounter Vitals Group     BP 10/26/23 0716 (!) 146/98     Systolic BP Percentile --      Diastolic BP Percentile --      Pulse Rate 10/26/23 0716 85     Resp 10/26/23 0716 (!) 24     Temp 10/26/23 0716 98.4 F (36.9 C)     Temp src --      SpO2 10/26/23 0716 99 %     Weight 10/26/23 0717 166 lb (75.3 kg)     Height 10/26/23 0717 5' (1.524 m)     Head Circumference --      Peak Flow --      Pain Score 10/26/23 0716 6     Pain Loc --      Pain Education --      Exclude from Growth Chart --     Most recent vital signs: Vitals:   10/26/23 0716  BP: (!) 146/98  Pulse: 85  Resp: (!) 24  Temp: 98.4 F (36.9 C)  SpO2: 99%     General: Awake, no distress.  CV:  Good peripheral perfusion.  Resp:  Normal effort.  Wheezing noted  bilaterally Abd:  No distention.  Other:  No swelling in legs.  No calf tenderness   ED Results / Procedures / Treatments   Labs (all labs ordered are listed, but only abnormal results are displayed) Labs Reviewed  BASIC METABOLIC PANEL - Abnormal; Notable for the following components:      Result Value   Sodium 132 (*)    Potassium 3.3 (*)    CO2 20 (*)    Glucose, Bld 152  (*)    Calcium 8.7 (*)    All other components within normal limits  CBC  POC URINE PREG, ED  TROPONIN I (HIGH SENSITIVITY)  TROPONIN I (HIGH SENSITIVITY)     EKG  My interpretation of EKG:  Normal sinus, no st elevation, no twi,normal intervals   RADIOLOGY I have reviewed the xray personally and interpreted and no pneumonia  PROCEDURES:  Critical Care performed: No  Procedures   MEDICATIONS ORDERED IN ED: Medications  ipratropium-albuterol (DUONEB) 0.5-2.5 (3) MG/3ML nebulizer solution 3 mL (has no administration in time range)  predniSONE (DELTASONE) tablet 60 mg (has no administration in time range)  potassium chloride SA (KLOR-CON M) CR tablet 40 mEq (has no administration in time range)     IMPRESSION / MDM / ASSESSMENT AND PLAN / ED COURSE  I reviewed the triage vital signs and the nursing notes.   Patient's presentation is most consistent with acute presentation with potential threat to life or bodily  function.   Patient comes in with continued symptoms over the past 8 days.  At this point I suspect that she could have an occult pneumonia being missed on x-ray versus a COPD exacerbation.  We discussed pulmonary embolism but patient is PERC negative we will hold off on CT imaging.  She declines pregnancy test given she reports a history of tubal ligation.  Will give a DuoNeb, dose of steroids and do ambulation trial and if staying above 90% patient can be discharged home.  Patient's labs did note some slight dehydration with low potassium low sodium , low bicarb.  Her troponin was negative and symptoms have going on for greater than 3 hours.  CBC no anemia.    Pt declined preg test.   Pt ambulatory sat is normal. Pt feels comfortable with plan for dc    FINAL CLINICAL IMPRESSION(S) / ED DIAGNOSES   Final diagnoses:  COPD exacerbation (HCC)     Rx / DC Orders   ED Discharge Orders          Ordered    predniSONE (DELTASONE) 20 MG tablet  Daily with  breakfast        10/26/23 0933    amoxicillin-clavulanate (AUGMENTIN) 875-125 MG tablet  2 times daily        10/26/23 0933    azithromycin (ZITHROMAX Z-PAK) 250 MG tablet        10/26/23 4098             Note:  This document was prepared using Dragon voice recognition software and may include unintentional dictation errors.   Concha Se, MD 10/26/23 1001

## 2024-05-21 ENCOUNTER — Other Ambulatory Visit: Payer: Self-pay

## 2024-05-21 ENCOUNTER — Emergency Department
Admission: EM | Admit: 2024-05-21 | Discharge: 2024-05-21 | Disposition: A | Discharge status: Home / Self Care | Payer: Self-pay | Attending: Emergency Medicine | Admitting: Emergency Medicine

## 2024-05-21 DIAGNOSIS — M79604 Pain in right leg: Secondary | ICD-10-CM | POA: Insufficient documentation

## 2024-05-21 HISTORY — DX: Transient cerebral ischemic attack, unspecified: G45.9

## 2024-05-21 HISTORY — DX: Chronic obstructive pulmonary disease, unspecified: J44.9

## 2024-05-21 MED ORDER — CYCLOBENZAPRINE HCL 5 MG PO TABS: 5.0000 mg | ORAL_TABLET | Freq: Three times a day (TID) | ORAL | 0 refills | Status: AC | PRN

## 2024-05-21 MED ORDER — KETOROLAC TROMETHAMINE 30 MG/ML IJ SOLN
30.0000 mg | Freq: Once | INTRAMUSCULAR | Status: AC
  Administered 2024-05-21: 30 mg via INTRAMUSCULAR
  Filled 2024-05-21: qty 1

## 2024-05-21 MED ORDER — CYCLOBENZAPRINE HCL 10 MG PO TABS
5.0000 mg | ORAL_TABLET | Freq: Once | ORAL | Status: AC
  Administered 2024-05-21: 5 mg via ORAL
  Filled 2024-05-21: qty 1

## 2024-05-21 MED ORDER — ACETAMINOPHEN 325 MG PO TABS
650.0000 mg | ORAL_TABLET | Freq: Once | ORAL | Status: AC
  Administered 2024-05-21: 650 mg via ORAL
  Filled 2024-05-21: qty 2

## 2024-05-21 MED ORDER — MELOXICAM 7.5 MG PO TABS: 7.5000 mg | ORAL_TABLET | Freq: Every day | ORAL | 0 refills | Status: AC

## 2024-05-21 NOTE — ED Provider Notes (Signed)
 [facility] Emergency Department Provider Note     Event Date/Time   First MD Initiated Contact with Patient 05/21/24 1227     (approximate)   History   Leg Pain   HPI  Lindsey Acosta is a 48 y.o. female with a history of TIA presents to the ED for evaluation of right leg pain with onset of this morning.  She denies injury or trauma.  Patient reports she woke up and had sharp pain that radiate from her mid thigh to ankle.  She remains ambulatory.  She reports similar symptoms in the past where the pain will usually go away on its own.  Denies urinary symptoms, visual changes, vomiting, chest pain, shortness of breath no abdominal pain     Physical Exam   Triage Vital Signs: ED Triage Vitals  Encounter Vitals Group     BP 05/21/24 1208 126/83     Girls Systolic BP Percentile --      Girls Diastolic BP Percentile --      Boys Systolic BP Percentile --      Boys Diastolic BP Percentile --      Pulse Rate 05/21/24 1208 86     Resp 05/21/24 1208 16     Temp 05/21/24 1208 98.7 F (37.1 C)     Temp Source 05/21/24 1208 Oral     SpO2 05/21/24 1208 99 %     Weight 05/21/24 1209 155 lb (70.3 kg)     Height 05/21/24 1209 5' (1.524 m)     Head Circumference --      Peak Flow --      Pain Score 05/21/24 1205 8     Pain Loc --      Pain Education --      Exclude from Growth Chart --     Most recent vital signs: Vitals:   05/21/24 1208  BP: 126/83  Pulse: 86  Resp: 16  Temp: 98.7 F (37.1 C)  SpO2: 99%    General: Well appearing. Alert and oriented. INAD.  Speaking in complete sentences. Head:  NCAT.  CV:  Good peripheral perfusion.  RESP:  Normal effort.  No peripheral edema. BACK:  Spinous process is midline without deformity or tenderness. MSK:   Full ROM in all joints. No swelling, deformity or tenderness. Right SLRT @ 60 degrees. NEURO: Cranial nerves II-XII intact. No focal deficits. Sensation and motor function intact. 5/5 muscle  strength of LE. Gait is steady.   Pedal pulses palpated bilaterally.  Symmetric.  Good capillary refill and F ROM of all digits bilaterally.  ED Results / Procedures / Treatments   Labs (all labs ordered are listed, but only abnormal results are displayed) Labs Reviewed - No data to display  No results found.  PROCEDURES:  Critical Care performed: No  Procedures  MEDICATIONS ORDERED IN ED: Medications  ketorolac  (TORADOL ) 30 MG/ML injection 30 mg (30 mg Intramuscular Given 05/21/24 1335)  cyclobenzaprine (FLEXERIL) tablet 5 mg (5 mg Oral Given 05/21/24 1335)  acetaminophen  (TYLENOL ) tablet 650 mg (650 mg Oral Given 05/21/24 1335)   IMPRESSION / MDM / ASSESSMENT AND PLAN / ED COURSE  I reviewed the triage vital signs and the nursing notes.                               48 y.o. female presents to the emergency department for evaluation and treatment of acute right leg  pain. See HPI for further details.   Differential diagnosis includes, but is not limited to sciatica, strain, bursitis, DVT considered but less likely  Patient's presentation is most consistent with acute complicated illness / injury requiring diagnostic workup.  Patient is alert and oriented.  She is hemodynamically stable.  Physical exam findings are stated above.  Pertinent for straight leg raise test at 60 degrees of the right leg makes my suspicion for sciatica most likely on my differential.  Offered patient prednisone  however she reports adverse effects with this medication and declined.  ED pain management with Tylenol , Flexeril and Toradol  IM provided some improvement.  I will continue this regimen outpatient and she is encouraged to follow-up with her primary care for treatment effectiveness.  No indication for further workup at this time.  Patient verbalized understanding and is in agreement with this care plan.  She is stable and says ready condition for discharge home.  ED return precautions  discussed.  FINAL CLINICAL IMPRESSION(S) / ED DIAGNOSES   Final diagnoses:  Right leg pain   Rx / DC Orders   ED Discharge Orders          Ordered    cyclobenzaprine (FLEXERIL) 5 MG tablet  3 times daily PRN        05/21/24 1449    meloxicam  (MOBIC ) 7.5 MG tablet  Daily        05/21/24 1449           Note:  This document was prepared using Dragon voice recognition software and may include unintentional dictation errors.    Margrette, Rivky Clendenning A, PA-C 05/21/24 1632    Jossie Artist POUR, MD 05/22/24 [id number]

## 2024-05-21 NOTE — ED Triage Notes (Signed)
 Arrived by [facility]. Patient c/o pain and weakness in right leg. A&O x4 per EMS  Stroke screen negative per EMS  EMS vitals: 147/70 b/p 97 RA 93HR 95 CBG 16 RR  History TIA's

## 2024-05-21 NOTE — ED Notes (Signed)
Pt ambulatory to the restroom independently.  

## 2024-05-21 NOTE — Discharge Instructions (Addendum)
 You were evaluated in the ED for right leg pain.  Your physical exam is overall reassuring of any acute and life-threatening injury or illnesses.  Your presentation is clinically consistent with sciatica.  Please get plenty of rest and alternate Tylenol  and ibuprofen  for pain as needed.  Follow-up with your primary care provider for further evaluation in 1 week if symptoms persist.

## 2024-05-21 NOTE — ED Triage Notes (Signed)
 Pt to ED for R leg pain since this morning. Pt states concerned because when she had TIA's she also had leg pain. Pt is holding leg up, no drift. Denies injury. Face is symmetrical and no arm drift.  Leg is not red or swollen.
